# Patient Record
Sex: Female | Born: 2005
Health system: Southern US, Community
[De-identification: ages and names within clinical notes are randomized; demographics above are authoritative.]

## PROBLEM LIST (undated history)

## (undated) DIAGNOSIS — L309 Dermatitis, unspecified: Secondary | ICD-10-CM

## (undated) HISTORY — DX: Dermatitis, unspecified: L30.9

## (undated) HISTORY — PX: TONSILLECTOMY: SUR1361

## (undated) HISTORY — PX: ADENOIDECTOMY: SUR15

---
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
## 2023-01-04 ENCOUNTER — Ambulatory Visit (INDEPENDENT_AMBULATORY_CARE_PROVIDER_SITE_OTHER): Payer: Commercial Managed Care - PPO | Admitting: Orthopaedic Surgery

## 2023-01-04 DIAGNOSIS — E8889 Other specified metabolic disorders: Secondary | ICD-10-CM

## 2023-01-04 DIAGNOSIS — M25562 Pain in left knee: Secondary | ICD-10-CM | POA: Diagnosis not present

## 2023-01-04 MED ORDER — TRIAMCINOLONE ACETONIDE 40 MG/ML IJ SUSP
80.0000 mg | INTRAMUSCULAR | Status: AC | PRN
  Administered 2023-01-04: 80 mg via INTRA_ARTICULAR

## 2023-01-04 MED ORDER — LIDOCAINE HCL 1 % IJ SOLN
4.0000 mL | INTRAMUSCULAR | Status: AC | PRN
  Administered 2023-01-04: 4 mL

## 2023-01-04 NOTE — Progress Notes (Addendum)
Chief Complaint: Left worse than right knee pain     History of Present Illness:   12/28/2022: Presents today for left knee injection.  Tara Gibbs is a 17 y.o. female presents today with ongoing left worse than right medial base C-shaped pain.  This has been ongoing now for several years.  This has been limiting her ability to practice in cheerleading.  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  She did not have a specific injury.  She is here today for further discussion.    Surgical History:   None  PMH/PSH/Family History/Social History/Meds/Allergies:    Past Medical History:  Diagnosis Date  . Eczema    Past Surgical History:  Procedure Laterality Date  . ADENOIDECTOMY    . TONSILLECTOMY     Social History   Socioeconomic History  . Marital status: Single    Spouse name: Not on file  . Number of children: Not on file  . Years of education: Not on file  . Highest education level: Not on file  Occupational History  . Not on file  Tobacco Use  . Smoking status: Never  . Smokeless tobacco: Never  Vaping Use  . Vaping Use: Not on file  Substance and Sexual Activity  . Alcohol use: No  . Drug use: No  . Sexual activity: Not on file  Other Topics Concern  . Not on file  Social History Narrative  . Not on file   Social Determinants of Health   Financial Resource Strain: Not on file  Food Insecurity: Not on file  Transportation Needs: Not on file  Physical Activity: Not on file  Stress: Not on file  Social Connections: Not on file   Family History  Problem Relation Age of Onset  . Hashimoto's thyroiditis Mother   . Rheum arthritis Mother   . Migraines Mother   . Heart disease Mother   . Eczema Father   . Asthma Father    Allergies  Allergen Reactions  . Doxycycline Other (See Comments)    Depression  . Penicillins Swelling   Current Outpatient  Medications  Medication Sig Dispense Refill  . ascorbic acid (VITAMIN C) 1000 MG tablet Take by mouth. (Patient not taking: Reported on 04/10/2022)    . Cholecalciferol (VITAMIN D) 2000 units CAPS Take one capsule daily with food for 2 months and as directed by physician (Patient not taking: Reported on 04/10/2022) 60 capsule 1  . fluconazole (DIFLUCAN) 150 MG tablet Take 1 tablet by mouth today and 1 tablet 3 days from now 2 tablet 0  . metroNIDAZOLE (FLAGYL) 500 MG tablet Take 1 tablet (500 mg total) by mouth 2 (two) times daily. 14 tablet 0  . Multiple Vitamin (MULTIVITAMIN) tablet Take 1 tablet by mouth daily. (Patient not taking: Reported on 04/10/2022)    . norgestimate-ethinyl estradiol (ORTHO-CYCLEN) 0.25-35 MG-MCG tablet Take 1 tablet by mouth daily. Take only active pills (Patient not taking: Reported on 04/10/2022) 84 tablet 4  . triamcinolone cream (KENALOG) 0.1 %      No current facility-administered medications for this visit.   No results found.  Review of Systems:   A ROS was performed including pertinent positives and negatives as documented in the HPI.  Physical Exam :  Constitutional: NAD and appears stated age Neurological: Alert and oriented Psych: Appropriate affect and cooperative Last menstrual period 12/03/2022.   Comprehensive Musculoskeletal Exam:      Musculoskeletal Exam  Gait Normal  Alignment Normal   Right Left  Inspection Normal Normal  Palpation    Tenderness Medial C-shaped Medial C-shaped  Crepitus None None  Effusion None Mild  Range of Motion    Extension -5 -5  Flexion 130 130  Strength    Extension 5/5 5/5  Flexion 5/5 5/5  Ligament Exam     Generalized Laxity No No  Lachman Negative Negative   Pivot Shift Negative Negative  Anterior Drawer Negative Negative  Valgus at 0 Negative Negative  Valgus at 20 Negative Negative  Varus at 0 0 0  Varus at 20   0 0  Posterior Drawer at 90 0 0  Vascular/Lymphatic Exam    Edema None None   Venous Stasis Changes No No  Distal Circulation Normal Normal  Neurologic    Light Touch Sensation Intact Intact  Special Tests:      Imaging:   Xray (4 views right knee, 4 views left knee): Normal  MRI left knee: There is a small plica although there is significant Hoffa's fat pad hypertrophy with impingement  I personally reviewed and interpreted the radiographs.   Assessment:   17 y.o. 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  To this effect I did recommend an injection into this area to help with the swelling.  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 Consent was given by the patient. Immediately prior to procedure a time out was called to verify the correct patient, procedure, equipment, support staff and site/side marked as required. Patient was prepped and draped in the usual sterile fashion.        I personally saw and evaluated the patient, and participated in the management and treatment plan.  Vanetta Mulders, MD Attending Physician, Orthopedic Surgery  This document was dictated using Dragon voice recognition software. A reasonable attempt at proof reading has been made to minimize errors.

## 2023-01-28 ENCOUNTER — Encounter: Payer: Commercial Managed Care - PPO | Admitting: Nurse Practitioner

## 2023-01-31 ENCOUNTER — Encounter: Payer: Self-pay | Admitting: Radiology

## 2023-01-31 ENCOUNTER — Ambulatory Visit (INDEPENDENT_AMBULATORY_CARE_PROVIDER_SITE_OTHER): Payer: Commercial Managed Care - PPO | Admitting: Radiology

## 2023-01-31 VITALS — BP 102/68 | Ht 64.0 in | Wt 203.0 lb

## 2023-01-31 DIAGNOSIS — L678 Other hair color and hair shaft abnormalities: Secondary | ICD-10-CM | POA: Diagnosis not present

## 2023-01-31 DIAGNOSIS — L700 Acne vulgaris: Secondary | ICD-10-CM

## 2023-01-31 DIAGNOSIS — Z113 Encounter for screening for infections with a predominantly sexual mode of transmission: Secondary | ICD-10-CM

## 2023-01-31 DIAGNOSIS — N926 Irregular menstruation, unspecified: Secondary | ICD-10-CM | POA: Diagnosis not present

## 2023-01-31 LAB — PREGNANCY, URINE: Preg Test, Ur: NEGATIVE

## 2023-01-31 NOTE — Progress Notes (Signed)
   Tara Gibbs 18-Mar-2006 [id number]   History:  17 y.o. G0 here as a new patient c/o irregular periods, facial hair growth, acne on face, back and chest. This episdoe of irregular bleeding has lasted a month. She was on OCPs x 1 year but stopped because she was worried she would gain weight.Would like STI screening today  Gynecologic History No LMP recorded. (Menstrual status: Irregular Periods). Period Duration (Days): 5 Period Pattern: (!) Irregular Menstrual Flow: Moderate Menstrual Control: Maxi pad Dysmenorrhea: (!) Severe Dysmenorrhea Symptoms: Cramping Contraception/Family planning: condoms Sexually active: yes   Obstetric History OB History  Gravida Para Term Preterm AB Living  0 0 0 0 0 0  SAB IAB Ectopic Multiple Live Births  0 0 0 0 0     The following portions of the patient's history were reviewed and updated as appropriate: allergies, current medications, past family history, past medical history, past social history, past surgical history, and problem list.  Review of Systems Pertinent items noted in HPI and remainder of comprehensive ROS otherwise negative.   Past medical history, past surgical history, family history and social history were all reviewed and documented in the EPIC chart.   Exam:  Vitals:   01/31/23 0906  BP: 102/68  Weight: 203 lb (92.1 kg)  Height:  (1.626 m)   Body mass index is 34.84 kg/m.  General appearance:  Normal Respiratory  Auscultation:  Clear without wheezing or rhonchi Cardiovascular  Auscultation:  Regular rate, without rubs, murmurs or gallops  Edema/varicosities:  Not grossly evident Abdominal  Soft,nontender, without masses, guarding or rebound.  Liver/spleen:  No organomegaly noted  Hernia:  None appreciated  Skin  Inspection:  comedones on face and chest Genitourinary   Inguinal/mons:  Normal without inguinal adenopathy  External genitalia:  Normal appearing vulva with no masses, tenderness, or  lesions  BUS/Urethra/Skene's glands:  Normal without masses or exudate  Vagina:  Blood present, STI screen obtained via sureswab  Cervix:  Normal appearing without discharge or lesions  Uterus:  Normal in size, shape and contour.  Mobile, nontender  Adnexa/parametria:     Rt: Normal in size, without masses or tenderness.   Lt: Normal in size, without masses or tenderness.  Anus and perineum: Normal   Raynelle Fanning, CMA present for exam  Assessment/Plan:   1. Irregular menses - Pregnancy, urine; neg - DHEA-sulfate - FSH - LH - Prolactin  2. Acne vulgaris - Testos,Total,Free and SHBG (Female) - DHEA-sulfate  3. Abnormal facial hair - Testos,Total,Free and SHBG (Female) - DHEA-sulfate   Follow up 1 week to discuss labs  Arlie Solomons B WHNP-BC 9:35 AM 01/31/2023

## 2023-02-01 LAB — SURESWAB CT/NG/T. VAGINALIS
C. trachomatis RNA, TMA: NOT DETECTED
N. gonorrhoeae RNA, TMA: NOT DETECTED
Trichomonas vaginalis RNA: NOT DETECTED

## 2023-02-06 LAB — DHEA-SULFATE: DHEA-SO4: 278 ug/dL (ref 44–286)

## 2023-02-06 LAB — TESTOS,TOTAL,FREE AND SHBG (FEMALE)
Free Testosterone: 4.3 pg/mL (ref 0.1–6.4)
Sex Hormone Binding: 11.9 nmol/L — ABNORMAL LOW (ref 17–124)
Testosterone, Total, LC-MS-MS: 24 ng/dL (ref 2–45)

## 2023-02-06 LAB — LUTEINIZING HORMONE: LH: 9.5 m[IU]/mL

## 2023-02-06 LAB — FOLLICLE STIMULATING HORMONE: FSH: 9 m[IU]/mL

## 2023-02-06 LAB — PROLACTIN: Prolactin: 12.7 ng/mL

## 2023-02-08 ENCOUNTER — Ambulatory Visit (INDEPENDENT_AMBULATORY_CARE_PROVIDER_SITE_OTHER): Payer: Commercial Managed Care - PPO | Admitting: Radiology

## 2023-02-08 ENCOUNTER — Encounter: Payer: Self-pay | Admitting: Radiology

## 2023-02-08 VITALS — BP 118/76

## 2023-02-08 DIAGNOSIS — L680 Hirsutism: Secondary | ICD-10-CM

## 2023-02-08 DIAGNOSIS — N926 Irregular menstruation, unspecified: Secondary | ICD-10-CM | POA: Diagnosis not present

## 2023-02-08 MED ORDER — DROSPIRENONE-ETHINYL ESTRADIOL 3-0.02 MG PO TABS: 1.0000 | ORAL_TABLET | Freq: Every day | ORAL | 0 refills | Status: DC

## 2023-02-08 NOTE — Progress Notes (Signed)
   Tara Gibbs 06-Aug-2006 [id number]   History:  17 y.o. G0 here for follow up labs after she presented last week as a new patient c/o irregular periods, facial hair growth, acne on face, back and chest. This episdoe of irregular bleeding has lasted a month. She was on OCPs x 1 year but stopped because she was worried she would gain weight.  Gynecologic History No LMP recorded. (Menstrual status: Irregular Periods).   Contraception/Family planning: condoms Sexually active: yes   Obstetric History OB History  Gravida Para Term Preterm AB Living  0 0 0 0 0 0  SAB IAB Ectopic Multiple Live Births  0 0 0 0 0     The following portions of the patient's history were reviewed and updated as appropriate: allergies, current medications, past family history, past medical history, past social history, past surgical history, and problem list.  Review of Systems Pertinent items noted in HPI and remainder of comprehensive ROS otherwise negative.   Past medical history, past surgical history, family history and social history were all reviewed and documented in the EPIC chart.   Latest Reference Range & Units 01/31/23 09:23  DHEA-SO4 44 - 286 mcg/dL 409  LH mIU/mL 9.5  FSH mIU/mL 9.0  Prolactin ng/mL 12.7  Free Testosterone 0.1 - 6.4 pg/mL 4.3  Sex Horm Binding Glob, Serum 17 - 124 nmol/L 11.9 (L)  Testosterone, Total, LC-MS-MS 2 - 45 ng/dL 24  (L): Data is abnormally low  Exam:  Vitals:   02/08/23 1517  BP: 118/76   There is no height or weight on file to calculate BMI.   Assessment/Plan:   1. Irregular menses  2. Hirsutism - drospirenone-ethinyl estradiol (YAZ) 3-0.02 MG tablet; Take 1 tablet by mouth daily.  Dispense: 84 tablet; Refill: 0   Follow up 3 months  Kenise Barraco B WHNP-BC 3:28 PM 02/08/2023

## 2023-02-14 DIAGNOSIS — Z0279 Encounter for issue of other medical certificate: Secondary | ICD-10-CM | POA: Diagnosis not present

## 2023-02-26 ENCOUNTER — Encounter: Payer: Commercial Managed Care - PPO | Admitting: Nurse Practitioner

## 2023-02-27 ENCOUNTER — Encounter (HOSPITAL_BASED_OUTPATIENT_CLINIC_OR_DEPARTMENT_OTHER): Payer: Self-pay | Admitting: Physical Therapy

## 2023-02-27 ENCOUNTER — Ambulatory Visit (HOSPITAL_BASED_OUTPATIENT_CLINIC_OR_DEPARTMENT_OTHER): Payer: Commercial Managed Care - PPO | Attending: Orthopaedic Surgery | Admitting: Physical Therapy

## 2023-02-27 ENCOUNTER — Other Ambulatory Visit: Payer: Self-pay

## 2023-02-27 DIAGNOSIS — M25561 Pain in right knee: Secondary | ICD-10-CM | POA: Insufficient documentation

## 2023-02-27 DIAGNOSIS — M25562 Pain in left knee: Secondary | ICD-10-CM | POA: Insufficient documentation

## 2023-02-27 DIAGNOSIS — G8929 Other chronic pain: Secondary | ICD-10-CM | POA: Diagnosis not present

## 2023-02-27 DIAGNOSIS — R2689 Other abnormalities of gait and mobility: Secondary | ICD-10-CM

## 2023-02-27 DIAGNOSIS — E8889 Other specified metabolic disorders: Secondary | ICD-10-CM | POA: Insufficient documentation

## 2023-02-27 NOTE — Therapy (Signed)
OUTPATIENT PHYSICAL THERAPY LOWER EXTREMITY EVALUATION   Patient Name: Tara Gibbs MRN: [id number] DOB:2006-04-22, 17 y.o., female Today's Date: 02/27/2023  END OF SESSION:  PT End of Session - 02/27/23 1434     Visit Number 1    Number of Visits 6    Date for PT Re-Evaluation 04/10/23    PT Start Time 0804    PT Stop Time 0845    PT Time Calculation (min) 41 min    Activity Tolerance Patient tolerated treatment well    Behavior During Therapy [facility] for tasks assessed/performed             Past Medical History:  Diagnosis Date   Eczema    Past Surgical History:  Procedure Laterality Date   ADENOIDECTOMY     TONSILLECTOMY     Patient Active Problem List   Diagnosis Date Noted   Arthralgia 05/10/2021   Irregular menses 06/12/2019   Chronic back pain 12/04/2016   Acne 07/11/2016   State of stress 02/29/2016   Chronic rhinitis 01/13/2016   Frequent headaches 01/13/2016   Right ankle pain 07/18/2015   Obesity, unspecified 06/07/2014   Eczema 06/07/2014    PCP: Dr [name]   REFERRING PROVIDER: Dr Huel Cote   REFERRING DIAG:  Diagnosis  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 Bith knees hurt but the left is the worst. She has increasesd pain when she stands and does cheerleading. She can also have pain with long periods of sitting. 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   PERTINENT HISTORY: Right anke sprain remote; Low back pain  PAIN:  Are you having pain? Yes: NPRS scale:5 /10 Pain location: patella area and medial superior left knee  Pain description: aching   Aggravating factors: sports, standing, sitting  Relieving factors: rest   PRECAUTIONS: None  WEIGHT BEARING RESTRICTIONS: No  FALLS:  Has patient fallen in last 6 months? No  LIVING ENVIRONMENT: Stairs up to her house. Can feel it in her knee.  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: Cheerleading   PLOF: Independent  PATIENT GOALS:  To have less pain in her knee   NEXT MD VISIT:  Nothing scheduled   OBJECTIVE:   DIAGNOSTIC FINDINGS:  X-ray: (-)   PATIENT SURVEYS:  FOTO    COGNITION: Overall cognitive status: Within functional limits for tasks assessed     SENSATION: WFL  EDEMA:   MUSCLE LENGTH:  POSTURE: No Significant postural limitations  PALPATION:   LOWER EXTREMITY ROM:  Passive ROM Right eval Left eval  Hip flexion    Hip extension    Hip abduction    Hip adduction    Hip internal rotation    Hip external rotation    Knee flexion Pain at end range  Pain at end range   Knee extension    Ankle dorsiflexion    Ankle plantarflexion    Ankle inversion    Ankle eversion     (Blank rows = not tested)  LOWER EXTREMITY MMT:  MMT Right eval Left eval  Hip flexion 44.1 45.1  Hip extension    Hip abduction 47.2 47.5  Hip adduction    Hip internal rotation    Hip external rotation    Knee flexion    Knee extension 43.9 45.6  Ankle dorsiflexion    Ankle plantarflexion    Ankle inversion    Ankle eversion     (Blank rows = not tested)  LOWER EXTREMITY SPECIAL TESTS:  Squat: good form. Patient reportred  Single leg stance: More limited on the left     GAIT: No significant gait deviations  TODAY'S TREATMENT:                                                                                                                              DATE:   Access Code: [email or web address] URL: [email or web address] Date: 02/27/2023 Prepared by: Lorayne Bender  Exercises - Supine Bridge with Resistance  Band  - 1 x daily - 7 x weekly - 3 sets - 10 reps - Supine Active Straight Leg Raise  - 1 x daily - 7 x weekly - 3 sets - 10 reps - Side Stepping with Resistance at Ankles  - 1 x daily - 7 x weekly - 3 sets - 10 reps   PATIENT EDUCATION:  Education details: HEP, symptom management  Person educated: Patient Education method: Explanation, Demonstration, Tactile cues, Verbal cues, and Handouts Education comprehension: verbalized understanding, returned demonstration, verbal cues required, tactile cues required, and needs further education  HOME EXERCISE PROGRAM: Access Code: [email or web address] URL: [email or web address] Date: 02/27/2023 Prepared by: Lorayne Bender  Exercises - Supine Bridge with Resistance Band  - 1 x daily - 7 x weekly - 3 sets - 10 reps - Supine Active Straight Leg Raise  - 1 x daily - 7 x weekly - 3 sets - 10 reps - Side Stepping with Resistance at Ankles  - 1 x daily - 7 x weekly - 3 sets - 10 reps  ASSESSMENT:  CLINICAL IMPRESSION: Patient is a 17 year old female with bilateral knee pain left greater than right.  Her signs and symptoms are consistent with diagnosis of Hoffa's fat pad inflammation.  She also has tenderness to palpation in her left patellar tendon area.  She has increased pain with standing and walking.  She has  pain with end range left knee flexion.  She has mild quad weakness.  She would benefit from skilled therapy to decrease pain and increase her ability to perform activities that she likes to do as well as work activities.  OBJECTIVE IMPAIRMENTS: decreased activity tolerance, decreased strength, and pain.   ACTIVITY LIMITATIONS: carrying, lifting, standing, squatting, stairs, and locomotion level  PARTICIPATION LIMITATIONS:  sporting activity; work   PERSONAL FACTORS: 1 comorbidity: low back pain   are also affecting patient's functional outcome.   REHAB POTENTIAL: Excellent  CLINICAL DECISION MAKING: Stable/uncomplicated  EVALUATION  COMPLEXITY: Low   GOALS: Goals reviewed with patient? Yes  SHORT TERM GOALS: Target date:  03/20/2023    Patient will report a 50% reduction in tenderness to palpation in the left knee Baseline: Goal status: INITIAL  2.  Patient will increase gross bilateral LE strength by 5 lbs  Baseline:  Goal status: INITIAL  3.  Patient will be independent with base HEP  Baseline:  Goal status: INITIAL  LONG TERM GOALS: Target date: 04/24/2023    Patient will stand at work for greater than 1 hour without increased pain Baseline:  Goal status: INITIAL  2.  Patient will go up and down 8 steps without increased pain in order to get in and out of her house comfortably Baseline:  Goal status: INITIAL  3.  Patient will ambulate community distances without increased pain Baseline:  Goal status: INITIAL   PLAN:  PT FREQUENCY: 1x/week  PT DURATION: 6 weeks  PLANNED INTERVENTIONS: Therapeutic exercises, Therapeutic activity, Neuromuscular re-education, Balance training, Gait training, Patient/Family education, Self Care, Joint mobilization, Stair training, Aquatic Therapy, Dry Needling, Electrical stimulation, Cryotherapy, Moist heat, Taping, Ultrasound, Ionotophoresis 4mg /ml Dexamethasone, and Manual therapy  PLAN FOR NEXT SESSION: Consider taping next visit either for patellar tendinitis and for knee Stabilization.  Review current exercises.  Progress to eccentric's as tolerated.  Consider slant board squat.  Consider eccentric stepdown.  Consider single-leg stance.  If patient did not tire tolerate higher level activities continue to progress lower level activities.   Dessie Coma, PT 02/27/2023, 2:38 PM

## 2023-02-28 ENCOUNTER — Encounter (HOSPITAL_BASED_OUTPATIENT_CLINIC_OR_DEPARTMENT_OTHER): Payer: Self-pay | Admitting: Orthopaedic Surgery

## 2023-03-01 ENCOUNTER — Ambulatory Visit (HOSPITAL_BASED_OUTPATIENT_CLINIC_OR_DEPARTMENT_OTHER): Payer: Commercial Managed Care - PPO | Admitting: Orthopaedic Surgery

## 2023-03-06 ENCOUNTER — Ambulatory Visit (HOSPITAL_BASED_OUTPATIENT_CLINIC_OR_DEPARTMENT_OTHER): Payer: Commercial Managed Care - PPO | Admitting: Orthopaedic Surgery

## 2023-03-08 ENCOUNTER — Ambulatory Visit (HOSPITAL_BASED_OUTPATIENT_CLINIC_OR_DEPARTMENT_OTHER): Payer: Commercial Managed Care - PPO | Admitting: Orthopaedic Surgery

## 2023-03-09 ENCOUNTER — Ambulatory Visit (HOSPITAL_BASED_OUTPATIENT_CLINIC_OR_DEPARTMENT_OTHER): Payer: Commercial Managed Care - PPO | Admitting: Physical Therapy

## 2023-03-09 ENCOUNTER — Encounter (HOSPITAL_BASED_OUTPATIENT_CLINIC_OR_DEPARTMENT_OTHER): Payer: Self-pay | Admitting: Physical Therapy

## 2023-03-09 DIAGNOSIS — G8929 Other chronic pain: Secondary | ICD-10-CM

## 2023-03-09 DIAGNOSIS — M25561 Pain in right knee: Secondary | ICD-10-CM | POA: Diagnosis not present

## 2023-03-09 DIAGNOSIS — R2689 Other abnormalities of gait and mobility: Secondary | ICD-10-CM | POA: Diagnosis not present

## 2023-03-09 DIAGNOSIS — M25562 Pain in left knee: Secondary | ICD-10-CM | POA: Diagnosis not present

## 2023-03-09 DIAGNOSIS — E8889 Other specified metabolic disorders: Secondary | ICD-10-CM | POA: Diagnosis not present

## 2023-03-09 NOTE — Therapy (Signed)
OUTPATIENT PHYSICAL THERAPY LOWER EXTREMITY EVALUATION   Patient Name: Tara Gibbs MRN: [id number] DOB:2006-06-09, 17 y.o., female Today's Date: 03/09/2023  END OF SESSION:  PT End of Session - 03/09/23 1024     Visit Number 2    Number of Visits 6    Date for PT Re-Evaluation 04/10/23    PT Start Time 1015    PT Stop Time 1058    PT Time Calculation (min) 43 min    Activity Tolerance Patient tolerated treatment well    Behavior During Therapy [facility] for tasks assessed/performed             Past Medical History:  Diagnosis Date   Eczema    Past Surgical History:  Procedure Laterality Date   ADENOIDECTOMY     TONSILLECTOMY     Patient Active Problem List   Diagnosis Date Noted   Arthralgia 05/10/2021   Irregular menses 06/12/2019   Chronic back pain 12/04/2016   Acne 07/11/2016   State of stress 02/29/2016   Chronic rhinitis 01/13/2016   Frequent headaches 01/13/2016   Right ankle pain 07/18/2015   Obesity, unspecified 06/07/2014   Eczema 06/07/2014    PCP: Dr [name]   REFERRING PROVIDER: Dr Huel Cote   REFERRING DIAG:  Diagnosis  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 Bith knees hurt but the left is the worst. She has increasesd pain when she stands and does cheerleading. She can also have pain with long periods of sitting. 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   PERTINENT HISTORY: Right anke sprain remote; Low back pain  PAIN:  Are you having pain? Yes: NPRS scale:5 /10 Pain location: patella area and medial superior left knee  Pain description: aching   Aggravating factors: sports, standing, sitting  Relieving factors: rest   PRECAUTIONS: None  WEIGHT BEARING RESTRICTIONS: No  FALLS:  Has patient fallen in last 6 months? No  LIVING ENVIRONMENT: Stairs up to her house. Can feel it in her knee.  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: Cheerleading   PLOF: Independent  PATIENT GOALS:  To have less pain in her knee   NEXT MD VISIT:  Nothing scheduled   OBJECTIVE:   DIAGNOSTIC FINDINGS:  X-ray: (-)   PATIENT SURVEYS:  FOTO    COGNITION: Overall cognitive status: Within functional limits for tasks assessed     SENSATION: WFL  EDEMA:   MUSCLE LENGTH:  POSTURE: No Significant postural limitations  PALPATION:   LOWER EXTREMITY ROM:  Passive ROM Right eval Left eval  Hip flexion    Hip extension    Hip abduction    Hip adduction    Hip internal rotation    Hip external rotation    Knee flexion Pain at end range  Pain at end range   Knee extension    Ankle dorsiflexion    Ankle plantarflexion    Ankle inversion    Ankle eversion     (Blank rows = not tested)  LOWER EXTREMITY MMT:  MMT Right eval Left eval  Hip flexion 44.1 45.1  Hip extension    Hip abduction 47.2 47.5  Hip adduction    Hip internal rotation    Hip external rotation    Knee flexion    Knee extension 43.9 45.6  Ankle dorsiflexion    Ankle plantarflexion    Ankle inversion    Ankle eversion     (Blank rows = not tested)  LOWER EXTREMITY SPECIAL TESTS:  Squat: good form. Patient reportred  Single leg stance: More limited on the left     GAIT: No significant gait deviations  TODAY'S TREATMENT:                                                                                                                              DATE:  5/25 Manual: reviewed taping; benefits; donning/doffing using mconnel tape   SLR: bilateral extensor lag greater 2x10  Bridge 2x10  SL SLR 2x10    Leg  press 50 lbs 2x15 cybex     Eval: Access Code: [email or web address] URL: [email or web address] Date: 02/27/2023 Prepared by: Lorayne Bender  Exercises - Supine Bridge with Resistance Band  - 1 x daily - 7 x weekly - 3 sets - 10 reps - Supine Active Straight Leg Raise  - 1 x daily - 7 x weekly - 3 sets - 10 reps - Side Stepping with Resistance at Ankles  - 1 x daily - 7 x weekly - 3 sets - 10 reps   PATIENT EDUCATION:  Education details: HEP, symptom management  Person educated: Patient Education method: Explanation, Demonstration, Tactile cues, Verbal cues, and Handouts Education comprehension: verbalized understanding, returned demonstration, verbal cues required, tactile cues required, and needs further education  HOME EXERCISE PROGRAM: Access Code: [email or web address] URL: [email or web address] Date: 02/27/2023 Prepared by: Lorayne Bender  Exercises - Supine Bridge with Resistance Band  - 1 x daily - 7 x weekly - 3 sets - 10 reps - Supine Active Straight Leg Raise  - 1 x daily - 7 x weekly - 3 sets - 10 reps - Side Stepping with Resistance at Ankles  - 1 x daily - 7 x weekly - 3 sets - 10 reps  ASSESSMENT:  CLINICAL IMPRESSION: The patient was still challenged by her base exercises today. She was advised to continue with her current HEP at home. We added in the leg press today. We trialed [place] taping for knee stability. If it helps we can let her do it on her own next visit. Therapy will continue to progress as tolerated.  OBJECTIVE IMPAIRMENTS: decreased activity tolerance, decreased strength, and pain.   ACTIVITY LIMITATIONS: carrying, lifting, standing, squatting, stairs, and locomotion level  PARTICIPATION LIMITATIONS:  sporting activity; work   PERSONAL FACTORS: 1 comorbidity: low back pain   are also affecting patient's functional outcome.   REHAB POTENTIAL: Excellent  CLINICAL DECISION MAKING: Stable/uncomplicated  EVALUATION COMPLEXITY:  Low   GOALS: Goals reviewed with patient? Yes  SHORT TERM GOALS: Target date:  03/20/2023    Patient will report a 50% reduction in tenderness to palpation in the left knee Baseline: Goal status: INITIAL  2.  Patient will increase gross bilateral LE strength by 5 lbs  Baseline:  Goal status: INITIAL  3.  Patient will be independent with base HEP  Baseline:  Goal status: INITIAL  LONG TERM GOALS: Target date: 04/24/2023    Patient will stand at work for greater than 1 hour without increased pain Baseline:  Goal status: INITIAL  2.  Patient will go up and down 8 steps without increased pain in order to get in and out of her house comfortably Baseline:  Goal status: INITIAL  3.  Patient will ambulate community distances without increased pain Baseline:  Goal status: INITIAL   PLAN:  PT FREQUENCY: 1x/week  PT DURATION: 6 weeks  PLANNED INTERVENTIONS: Therapeutic exercises, Therapeutic activity, Neuromuscular re-education, Balance training, Gait training, Patient/Family education, Self Care, Joint mobilization, Stair training, Aquatic Therapy, Dry Needling, Electrical stimulation, Cryotherapy, Moist heat, Taping, Ultrasound, Ionotophoresis 4mg /ml Dexamethasone, and Manual therapy  PLAN FOR NEXT SESSION: Consider taping next visit either for patellar tendinitis and for knee Stabilization.  Review current exercises.  Progress to eccentric's as tolerated.  Consider slant board squat.  Consider eccentric stepdown.  Consider single-leg stance.  If patient did not tire tolerate higher level activities continue to progress lower level activities.   Dessie Coma, PT 03/09/2023, 10:29 AM

## 2023-03-16 ENCOUNTER — Ambulatory Visit (HOSPITAL_BASED_OUTPATIENT_CLINIC_OR_DEPARTMENT_OTHER)
Payer: Commercial Managed Care - PPO | Attending: Orthopaedic Surgery | Admitting: Rehabilitative and Restorative Service Providers"

## 2023-03-16 ENCOUNTER — Encounter (HOSPITAL_BASED_OUTPATIENT_CLINIC_OR_DEPARTMENT_OTHER): Payer: Self-pay | Admitting: Rehabilitative and Restorative Service Providers"

## 2023-03-16 DIAGNOSIS — R2689 Other abnormalities of gait and mobility: Secondary | ICD-10-CM | POA: Diagnosis not present

## 2023-03-16 DIAGNOSIS — M25561 Pain in right knee: Secondary | ICD-10-CM | POA: Insufficient documentation

## 2023-03-16 DIAGNOSIS — G8929 Other chronic pain: Secondary | ICD-10-CM | POA: Diagnosis not present

## 2023-03-16 DIAGNOSIS — M25562 Pain in left knee: Secondary | ICD-10-CM | POA: Insufficient documentation

## 2023-03-16 NOTE — Therapy (Signed)
OUTPATIENT PHYSICAL THERAPY LOWER EXTREMITY EVALUATION   Patient Name: Tara Gibbs MRN: [id number] DOB:22-Jan-2006, 17 y.o., female Today's Date: 03/16/2023  END OF SESSION:  PT End of Session - 03/16/23 0928     Visit Number 3    Number of Visits 6    Date for PT Re-Evaluation 04/10/23    PT Start Time 0919    PT Stop Time 1002    PT Time Calculation (min) 43 min    Activity Tolerance Patient tolerated treatment well;No increased pain    Behavior During Therapy [facility] for tasks assessed/performed             Past Medical History:  Diagnosis Date   Eczema    Past Surgical History:  Procedure Laterality Date   ADENOIDECTOMY     TONSILLECTOMY     Patient Active Problem List   Diagnosis Date Noted   Arthralgia 05/10/2021   Irregular menses 06/12/2019   Chronic back pain 12/04/2016   Acne 07/11/2016   State of stress 02/29/2016   Chronic rhinitis 01/13/2016   Frequent headaches 01/13/2016   Right ankle pain 07/18/2015   Obesity, unspecified 06/07/2014   Eczema 06/07/2014    PCP: Dr [name]   REFERRING PROVIDER: Dr Huel Cote   REFERRING DIAG:  Diagnosis  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 It is uncomfortable. I don't feel like the taping helped. It felt like a restraint. Standing, sitting, and stairs increases the pressure.    PERTINENT HISTORY: Right anke sprain remote; Low back pain  PAIN:  Are you having pain? Yes: NPRS scale:4/10 Pain location: patella area and medial superior left knee  Pain description: aching  Aggravating factors: sports, standing, sitting  Relieving factors: rest   PRECAUTIONS:  None  WEIGHT BEARING RESTRICTIONS: No  FALLS:  Has patient fallen in last 6 months? No  LIVING ENVIRONMENT: Stairs up to her house. Can feel it in her knee.  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: Cheerleading   PLOF: Independent  PATIENT GOALS:  To have less pain in her knee   NEXT MD VISIT:  Nothing scheduled   OBJECTIVE:   DIAGNOSTIC FINDINGS:  X-ray: (-)   PATIENT SURVEYS:  FOTO    COGNITION: Overall cognitive status: Within functional limits for tasks assessed     SENSATION: WFL  EDEMA:   MUSCLE LENGTH:  POSTURE: No Significant postural limitations  PALPATION:   LOWER EXTREMITY ROM:  Passive ROM Right eval Left eval  Hip flexion    Hip extension    Hip abduction    Hip adduction    Hip internal rotation    Hip external rotation    Knee flexion Pain at end range  Pain at end range   Knee extension    Ankle dorsiflexion    Ankle plantarflexion    Ankle inversion    Ankle eversion     (Blank rows = not tested)  LOWER EXTREMITY MMT:  MMT Right eval Left eval  Hip flexion 44.1 45.1  Hip extension    Hip abduction 47.2 47.5  Hip adduction  Hip internal rotation    Hip external rotation    Knee flexion    Knee extension 43.9 45.6  Ankle dorsiflexion    Ankle plantarflexion    Ankle inversion    Ankle eversion     (Blank rows = not tested)  LOWER EXTREMITY SPECIAL TESTS:  Squat: good form. Patient reportred  Single leg stance: More limited on the left     GAIT: No significant gait deviations  TODAY'S TREATMENT:                                                                                                                              DATE:   [facility] Adult PT Treatment:                                                DATE: 03/16/23 Therapeutic Exercise: Attempted elliptical forward and backward x 1 min each to determine pressure and tolerance with pt stating increased pressure both  directions Nustep level 6 x 3 min with focus on quad contraction and control SLR x 15 VMO SLR x 15 SAQ 3 lbs x 20 unilat and bil each Ball squeeze supine x 20 Ball squeeze with bridge x 20 Reviewed HEP; modified monster walks for pt to keep knees in neutral and not angulated. Discussed with pt importance of icing x 15 min 2x/day to assist with inflammation   5/25 Manual: reviewed taping; benefits; donning/doffing using mconnel tape   SLR: bilateral extensor lag greater 2x10  Bridge 2x10  SL SLR 2x10    Leg press 50 lbs 2x15 cybex     Eval: Access Code: [email or web address] URL: [email or web address] Date: 02/27/2023 Prepared by: Lorayne Bender  Exercises - Supine Bridge with Resistance Band  - 1 x daily - 7 x weekly - 3 sets - 10 reps - Supine Active Straight Leg Raise  - 1 x daily - 7 x weekly - 3 sets - 10 reps - Side Stepping with Resistance at Ankles  - 1 x daily - 7 x weekly - 3 sets - 10 reps   PATIENT EDUCATION:  Education details: HEP, symptom management  Person educated: Patient Education method: Explanation, Demonstration, Tactile cues, Verbal cues, and Handouts Education comprehension: verbalized understanding, returned demonstration, verbal cues required, tactile cues required, and needs further education  HOME EXERCISE PROGRAM: Access Code: [email or web address] URL: [email or web address] Date: 02/27/2023 Prepared by: Lorayne Bender  Exercises - Supine Bridge with Resistance Band  - 1 x daily - 7 x weekly - 3 sets - 10 reps - Supine Active Straight Leg Raise  - 1 x daily - 7 x weekly - 3 sets - 10 reps - Side Stepping with Resistance at Ankles  - 1 x daily - 7 x weekly - 3 sets - 10 reps  ASSESSMENT:  CLINICAL IMPRESSION: Pt reports she  was not favorable of [place] taping and she has been compliant with HEP. 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 Pt with continued extensor lag with therex L LE but can self-correct with cueing. Pt reports pain to be predominantly in L LE with functional mobility. Bil hip adductors L > R not as strong a contraction as rest of Quad. Therapy will continue to progress as tolerated.  OBJECTIVE IMPAIRMENTS: decreased activity tolerance, decreased strength, and pain.   ACTIVITY LIMITATIONS: carrying, lifting, standing, squatting, stairs, and locomotion level  PARTICIPATION LIMITATIONS:  sporting activity; work   PERSONAL FACTORS: 1 comorbidity: low back pain   are also affecting patient's functional outcome.   REHAB POTENTIAL: Excellent  CLINICAL DECISION MAKING: Stable/uncomplicated  EVALUATION COMPLEXITY: Low   GOALS: Goals reviewed with patient? Yes  SHORT TERM GOALS: Target date: 03/20/2023    Patient will report a 50% reduction in tenderness to palpation in the left knee Baseline: Goal status: INITIAL  2.  Patient will increase gross bilateral LE strength by 5 lbs  Baseline:  Goal status: INITIAL  3.  Patient will be independent with base HEP  Baseline:  Goal status: INITIAL  LONG TERM GOALS: Target date: 04/24/2023    Patient will stand at work for greater than 1 hour without increased pain Baseline:  Goal status: INITIAL  2.  Patient will go up and down 8 steps without increased pain in order to get in and out of her house comfortably Baseline:  Goal status: INITIAL  3.  Patient will ambulate community distances without increased pain Baseline:  Goal status: INITIAL   PLAN:  PT FREQUENCY: 1x/week  PT DURATION: 6 weeks  PLANNED INTERVENTIONS: Therapeutic exercises, Therapeutic activity, Neuromuscular re-education, Balance training, Gait training, Patient/Family education, Self Care, Joint mobilization, Stair training, Aquatic Therapy, Dry Needling, Electrical stimulation, Cryotherapy, Moist heat, Taping, Ultrasound, Ionotophoresis 4mg /ml Dexamethasone,  and Manual therapy  PLAN FOR NEXT SESSION:  Progress to eccentric's as tolerated.  Consider slant board squat.  Consider eccentric stepdown.  Consider single-leg stance. Work on hip adductors bil.  If patient did not tolerate higher level activities continue to progress lower level activities.

## 2023-03-21 ENCOUNTER — Ambulatory Visit (HOSPITAL_BASED_OUTPATIENT_CLINIC_OR_DEPARTMENT_OTHER): Payer: Commercial Managed Care - PPO | Admitting: Orthopaedic Surgery

## 2023-03-23 ENCOUNTER — Ambulatory Visit (HOSPITAL_BASED_OUTPATIENT_CLINIC_OR_DEPARTMENT_OTHER): Payer: Commercial Managed Care - PPO

## 2023-03-29 ENCOUNTER — Ambulatory Visit (HOSPITAL_BASED_OUTPATIENT_CLINIC_OR_DEPARTMENT_OTHER): Payer: Commercial Managed Care - PPO | Admitting: Orthopaedic Surgery

## 2023-03-30 ENCOUNTER — Ambulatory Visit (HOSPITAL_BASED_OUTPATIENT_CLINIC_OR_DEPARTMENT_OTHER): Payer: Commercial Managed Care - PPO | Admitting: Rehabilitative and Restorative Service Providers"

## 2023-04-03 ENCOUNTER — Ambulatory Visit (HOSPITAL_BASED_OUTPATIENT_CLINIC_OR_DEPARTMENT_OTHER): Payer: Self-pay | Admitting: Orthopaedic Surgery

## 2023-04-03 ENCOUNTER — Ambulatory Visit (INDEPENDENT_AMBULATORY_CARE_PROVIDER_SITE_OTHER): Payer: Commercial Managed Care - PPO | Admitting: Orthopaedic Surgery

## 2023-04-03 DIAGNOSIS — M6752 Plica syndrome, left knee: Secondary | ICD-10-CM

## 2023-04-03 DIAGNOSIS — E8889 Other specified metabolic disorders: Secondary | ICD-10-CM

## 2023-04-03 NOTE — Progress Notes (Signed)
Chief Complaint: Left worse than right knee pain     History of Present Illness:   04/03/2023: Presents today for follow-up of her left knee.  This is continuing to be painful when standing for longer periods of time.  She has begun CNA training and as result is having a very hard time staying active on the leg all day.  She is here today for further discussion.  She did get initially very good relief from her injection but this is worn off  Tara Gibbs is a 17 y.o. female presents today with ongoing left worse than right medial base C-shaped pain.  This has been ongoing now for several years.  This has been limiting her ability to practice in cheerleading.  She is a Holiday representative at [employer].  She is experiencing swelling after most activities and practices.  She did not have a specific injury.  She is here today for further discussion.    Surgical History:   None  PMH/PSH/Family History/Social History/Meds/Allergies:    Past Medical History:  Diagnosis Date   Eczema    Past Surgical History:  Procedure Laterality Date   ADENOIDECTOMY     TONSILLECTOMY     Social History   Socioeconomic History   Marital status: Single    Spouse name: Not on file   Number of children: Not on file   Years of education: Not on file   Highest education level: Not on file  Occupational History   Not on file  Tobacco Use   Smoking status: Never    Passive exposure: Never   Smokeless tobacco: Never  Vaping Use   Vaping Use: Not on file  Substance and Sexual Activity   Alcohol use: No   Drug use: No   Sexual activity: Yes    Partners: Male    Birth control/protection: None, Condom    Comment: menarche 17yo, sexual debut 17yo  Other Topics Concern   Not on file  Social History Narrative   Not on file   Social Determinants of Health   Financial Resource Strain: Not on file  Food Insecurity: Not on file  Transportation Needs: Not on file   Physical Activity: Not on file  Stress: Not on file  Social Connections: Not on file   Family History  Problem Relation Age of Onset   Hashimoto's thyroiditis Mother    Rheum arthritis Mother    Migraines Mother    Heart disease Mother    Polycystic ovary syndrome Mother    Eczema Father    Asthma Father    Allergies  Allergen Reactions   Doxycycline Other (See Comments)    Depression   Penicillins Swelling   Current Outpatient Medications  Medication Sig Dispense Refill   drospirenone-ethinyl estradiol (YAZ) 3-0.02 MG tablet Take 1 tablet by mouth daily. 84 tablet 0   No current facility-administered medications for this visit.   No results found.  Review of Systems:   A ROS was performed including pertinent positives and negatives as documented in the HPI.  Physical Exam :   Constitutional: NAD and appears stated age Neurological: Alert and oriented Psych: Appropriate affect and cooperative There were no vitals taken for this visit.   Comprehensive Musculoskeletal Exam:      Musculoskeletal Exam  Gait Normal  Alignment  Normal   Right Left  Inspection Normal Normal  Palpation    Tenderness Medial C-shaped Medial C-shaped  Crepitus None None  Effusion None Mild  Range of Motion    Extension -5 -5  Flexion 130 130  Strength    Extension 5/5 5/5  Flexion 5/5 5/5  Ligament Exam     Generalized Laxity No No  Lachman Negative Negative   Pivot Shift Negative Negative  Anterior Drawer Negative Negative  Valgus at 0 Negative Negative  Valgus at 20 Negative Negative  Varus at 0 0 0  Varus at 20   0 0  Posterior Drawer at 90 0 0  Vascular/Lymphatic Exam    Edema None None  Venous Stasis Changes No No  Distal Circulation Normal Normal  Neurologic    Light Touch Sensation Intact Intact  Special Tests: Positive C-shaped pain about the left knee medially     Imaging:   Xray (4 views right knee, 4 views left knee): Normal  MRI left knee: There is a  small plica although there is significant Hoffa's fat pad hypertrophy with impingement  I personally reviewed and interpreted the radiographs.   Assessment:   17 y.o. 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  I did describe that at today's visit she has undergone physical therapy which did somewhat aggravate the pain in addition to an injection which did give her transient relief.  Given the fact that these have not been more permanent solutions I do believe she would ultimately benefit from left knee arthroscopy with plica excision in office debridement.  At this time she would like to consider this.  We did discuss the risks and limitations associated with this.  She would like to proceed with this after discussion Plan :    -Plan for left knee arthroscopy with plica excision and Hoffa's fat pad debridement   After a lengthy discussion of treatment options, including risks, benefits, alternatives, complications of surgical and nonsurgical conservative options, the patient elected surgical repair.   The patient  is aware of the material risks  and complications including, but not limited to injury to adjacent structures, neurovascular injury, infection, numbness, bleeding, implant failure, thermal burns, stiffness, persistent pain, failure to heal, disease transmission from allograft, need for further surgery, dislocation, anesthetic risks, blood clots, risks of death,and others. The probabilities of surgical success and failure discussed with patient given their particular co-morbidities.The time and nature of expected rehabilitation and recovery was discussed.The patient's questions were all answered preoperatively.  No barriers to understanding were noted. I explained the natural history of the disease process and Rx rationale.  I explained to the patient what I considered to be reasonable expectations  given their personal situation.  The final treatment plan was arrived at through a shared patient decision making process model.     I personally saw and evaluated the patient, and participated in the management and treatment plan.  Huel Cote, MD Attending Physician, Orthopedic Surgery  This document was dictated using Dragon voice recognition software. A reasonable attempt at proof reading has been made to minimize errors.

## 2023-04-10 ENCOUNTER — Telehealth: Payer: Self-pay | Admitting: Orthopaedic Surgery

## 2023-04-10 NOTE — Telephone Encounter (Signed)
Patient left voicemail message stating she received your call and has a couple of questions regarding surgery.  She is thinking August would be a good time for the procedure, but her mother wants to get an idea of the cost and what the insurance will pay.  Best call back number is patient's cell at [phone].   Please call patient before calling mother.

## 2023-04-16 NOTE — Telephone Encounter (Signed)
I spoke with the patient, and her mother in regards to surgery. Patient has been scheduled for surgery on 06/10/23 at [facility]. All questions were answered.

## 2023-05-02 ENCOUNTER — Other Ambulatory Visit: Payer: Self-pay | Admitting: Radiology

## 2023-05-02 DIAGNOSIS — L680 Hirsutism: Secondary | ICD-10-CM

## 2023-05-02 DIAGNOSIS — N926 Irregular menstruation, unspecified: Secondary | ICD-10-CM

## 2023-05-02 NOTE — Telephone Encounter (Signed)
Medication refill request: Tara Gibbs Last OV:  02-08-23 Next AEX: 05-10-23 Last MMG (if hormonal medication request): none Refill authorized: please approve if appropriate

## 2023-05-10 ENCOUNTER — Encounter: Payer: Self-pay | Admitting: Radiology

## 2023-05-10 ENCOUNTER — Ambulatory Visit (INDEPENDENT_AMBULATORY_CARE_PROVIDER_SITE_OTHER): Payer: Commercial Managed Care - PPO | Admitting: Radiology

## 2023-05-10 DIAGNOSIS — N926 Irregular menstruation, unspecified: Secondary | ICD-10-CM

## 2023-05-10 DIAGNOSIS — L680 Hirsutism: Secondary | ICD-10-CM | POA: Diagnosis not present

## 2023-05-10 MED ORDER — DROSPIRENONE-ETHINYL ESTRADIOL 3-0.02 MG PO TABS: 1.0000 | ORAL_TABLET | Freq: Every day | ORAL | 4 refills | Status: DC

## 2023-05-10 NOTE — Progress Notes (Signed)
   Tara Gibbs 02-Jun-2006 [id number]   History: 17 y.o. G0 here for follow up after starting OCPs. She reports she missed pills and had BTB last week. Started new pack of OCPs last Sunday after missing 2 nonconsecutive pills instead of doubling up.  Gynecologic History No LMP recorded. (Menstrual status: Irregular Periods). Contraception: OCP (estrogen/progesterone)  Obstetric History OB History  Gravida Para Term Preterm AB Living  0 0 0 0 0 0  SAB IAB Ectopic Multiple Live Births  0 0 0 0 0     The following portions of the patient's history were reviewed and updated as appropriate: allergies, current medications, past family history, past medical history, past social history, past surgical history, and problem list.  Review of Systems Pertinent items noted in HPI and remainder of comprehensive ROS otherwise negative.     Past medical history, past surgical history, family history and social history were all reviewed and documented in the EPIC chart.  ROS:  A ROS was performed and pertinent positives and negatives are included.  Exam:  Vitals:   05/10/23 0816  BP: 102/64  Weight: 205 lb (93 kg)   Body mass index is 35.19 kg/m.   Assessment/Plan: 1. Irregular menses - drospirenone-ethinyl estradiol (NIKKI) 3-0.02 MG tablet; Take 1 tablet by mouth daily.  Dispense: 84 tablet; Refill: 4  2. Hirsutism - drospirenone-ethinyl estradiol (NIKKI) 3-0.02 MG tablet; Take 1 tablet by mouth daily.  Dispense: 84 tablet; Refill: 4   Doing well, discussed the importance of taking on time. Declined any other method. F/u for AEX 19 mos. Arlie Solomons, WHNP-BC

## 2023-06-24 ENCOUNTER — Encounter (HOSPITAL_BASED_OUTPATIENT_CLINIC_OR_DEPARTMENT_OTHER): Payer: Commercial Managed Care - PPO | Admitting: Orthopaedic Surgery

## 2023-10-14 ENCOUNTER — Ambulatory Visit: Payer: Self-pay | Admitting: Pediatrics

## 2023-10-14 NOTE — Telephone Encounter (Signed)
Copied from CRM [id number]. Topic: Clinical - Red Word Triage >> Oct 14, 2023 12:27 PM Isabell A wrote: Red Word that prompted transfer to Nurse Triage: Chest pain due to cough  Chief Complaint: Dry cough Symptoms: Cough and chest discomfort related to cough Frequency: Over the weekend Pertinent Negatives: Patient denies fever Disposition: [] ED /[] Urgent Care (no appt availability in office) / [] Appointment(In office/virtual)/ []  [facility]/ [] Home Care/ [] Refused Recommended Disposition /[] [facility]/ []  Follow-up with PCP Additional Notes: Patient called in complaining of a dry hacking cough that is causing chest discomfort. Patient stated that yellowish phlegm is sometimes present. Patient is not established with a PCP and wished to be seen in the office for cough as well as establish PCP. Patient denies difficulty breathing, but states she feels winded and an increase in HR when moving around. Advised UC in order to see a provider today. Assisted patient with locating an UC that is nearby with no wait. Advised patient to go now, but she said she may not be able to go until tomorrow when she is not at work. Also assisted patient with making a new patient visit in order to establish a PCP. Scheduled for 1/24 at [facility] at [facility].   Reason for Disposition  Wheezing is present  Answer Assessment - Initial Assessment Questions 1. ONSET: "When did the cough begin?"      Over the weekend  2. SEVERITY: "How bad is the cough today?"      "Annoying"  3. SPUTUM: "Describe the color of your sputum" (none, dry cough; clear, white, yellow, green)     Yellowish phlegm present  4. HEMOPTYSIS: "Are you coughing up any blood?" If so ask: "How much?" (flecks, streaks, tablespoons, etc.)     Denies blood  5. DIFFICULTY BREATHING: "Are you having difficulty breathing?" If Yes, ask: "How bad is it?" (e.g., mild, moderate, severe)    - MILD: No SOB at rest, mild SOB with walking,  speaks normally in sentences, can lie down, no retractions, pulse < 100.    - MODERATE: SOB at rest, SOB with minimal exertion and prefers to sit, cannot lie down flat, speaks in phrases, mild retractions, audible wheezing, pulse 100-120.    - SEVERE: Very SOB at rest, speaks in single words, struggling to breathe, sitting hunched forward, retractions, pulse > 120      Moderate- "feels winded" when moving around and talking, slight wheezing present  6. FEVER: "Do you have a fever?" If Yes, ask: "What is your temperature, how was it measured, and when did it start?"     Denies- mom has been taking temperature  7. CARDIAC HISTORY: "Do you have any history of heart disease?" (e.g., heart attack, congestive heart failure)      Denies  8. LUNG HISTORY: "Do you have any history of lung disease?"  (e.g., pulmonary embolus, asthma, emphysema)     Denies  10. OTHER SYMPTOMS: "Do you have any other symptoms?" (e.g., runny nose, wheezing, chest pain)       Interferring with sleep, chest pain related to cough, slight sinus pressure, congestion in throat, increased HR with movement  Protocols used: Cough - Acute Non-Productive-A-AH

## 2023-10-15 ENCOUNTER — Other Ambulatory Visit (HOSPITAL_BASED_OUTPATIENT_CLINIC_OR_DEPARTMENT_OTHER): Payer: Self-pay

## 2023-10-15 ENCOUNTER — Ambulatory Visit: Payer: Commercial Managed Care - PPO

## 2023-10-15 ENCOUNTER — Ambulatory Visit
Admission: EM | Admit: 2023-10-15 | Discharge: 2023-10-15 | Disposition: A | Discharge status: Home / Self Care | Payer: Commercial Managed Care - PPO

## 2023-10-15 DIAGNOSIS — J209 Acute bronchitis, unspecified: Secondary | ICD-10-CM | POA: Diagnosis not present

## 2023-10-15 MED ORDER — PREDNISONE 20 MG PO TABS
40.0000 mg | ORAL_TABLET | Freq: Every day | ORAL | 0 refills | Status: AC
  Filled 2023-10-15: qty 10, 5d supply, fill #0

## 2023-10-15 MED ORDER — AZITHROMYCIN 250 MG PO TABS
250.0000 mg | ORAL_TABLET | Freq: Every day | ORAL | 0 refills | Status: DC
  Filled 2023-10-15: qty 6, 5d supply, fill #0

## 2023-10-15 NOTE — ED Triage Notes (Signed)
"  Started with Cough Saturday night now with seal sounding bark, chest pain/headache only with cough, I fell I have sob at times".

## 2023-10-15 NOTE — ED Provider Notes (Signed)
 [facility]    CSN: [id number] Arrival date & time: 10/15/23  1057      History   Chief Complaint Chief Complaint  Patient presents with   Cough    HPI Tara Gibbs is a 17 y.o. female.   Patient here today for evaluation of cough that started several months ago that has now turned into a barking cough.  She reports some headache at times with cough.  She does have shortness of breath at times as well.  She denies any fever.  The history is provided by the patient.  Cough Associated symptoms: no chills, no ear pain, no eye discharge, no fever, no shortness of breath, no sore throat and no wheezing     Past Medical History:  Diagnosis Date   Eczema     Patient Active Problem List   Diagnosis Date Noted   Arthralgia 05/10/2021   Irregular menses 06/12/2019   Chronic back pain 12/04/2016   Acne 07/11/2016   State of stress 02/29/2016   Chronic rhinitis 01/13/2016   Frequent headaches 01/13/2016   Right ankle pain 07/18/2015   Obesity, unspecified 06/07/2014   Eczema 06/07/2014    Past Surgical History:  Procedure Laterality Date   ADENOIDECTOMY     TONSILLECTOMY      OB History     Gravida  0   Para  0   Term  0   Preterm  0   AB  0   Living  0      SAB  0   IAB  0   Ectopic  0   Multiple  0   Live Births  0            Home Medications    Prior to Admission medications   Medication Sig Start Date End Date Taking? Authorizing Provider  azithromycin  (ZITHROMAX ) 250 MG tablet Take 2 tablets (500 mg total) by mouth on day 1, and then take 1 tablet (250 mg total) daily on days 2 through 5. 10/15/23  Yes Billy Asberry FALCON, PA-C  drospirenone -ethinyl estradiol  (NIKKI ) 3-0.02 MG tablet Take 1 tablet by mouth daily. 05/10/23  Yes Chrzanowski, Jami B, NP  predniSONE  (DELTASONE ) 20 MG tablet Take 2 tablets (40 mg total) by mouth daily with breakfast for 5 days. 10/15/23 10/20/23 Yes Billy Asberry FALCON, PA-C  UNABLE TO FIND Med  Name: Multi-Symptom cold and Flu.   Yes [provider]  FLUCELVAX 0.5 ML injection Inject 0.5 mLs into the muscle once. 06/25/23   [provider]    Family History Family History  Problem Relation Age of Onset   Hashimoto's thyroiditis Mother    Rheum arthritis Mother    Migraines Mother    Heart disease Mother    Polycystic ovary syndrome Mother    Eczema Father    Asthma Father     Social History Social History   Tobacco Use   Smoking status: Never    Passive exposure: Never   Smokeless tobacco: Never  Substance Use Topics   Alcohol use: No   Drug use: No     Allergies   Doxycycline  and Penicillins   Review of Systems Review of Systems  Constitutional:  Negative for chills and fever.  HENT:  Positive for congestion. Negative for ear pain and sore throat.   Eyes:  Negative for discharge and redness.  Respiratory:  Positive for cough. Negative for shortness of breath and wheezing.   Gastrointestinal:  Negative for abdominal  pain, diarrhea, nausea and vomiting.     Physical Exam Triage Vital Signs ED Triage Vitals  Encounter Vitals Group     BP 10/15/23 1106 110/68     Systolic BP Percentile --      Diastolic BP Percentile --      Pulse Rate 10/15/23 1106 86     Resp 10/15/23 1106 20     Temp 10/15/23 1106 98.7 F (37.1 C)     Temp Source 10/15/23 1106 Oral     SpO2 10/15/23 1106 98 %     Weight 10/15/23 1107 204 lb (92.5 kg)     Height 10/15/23 1107 5' 4 (1.626 m)     Head Circumference --      Peak Flow --      Pain Score 10/15/23 1106 0     Pain Loc --      Pain Education --      Exclude from Growth Chart --    No data found.  Updated Vital Signs BP 110/68 (BP Location: Right Arm)   Pulse 86   Temp 98.7 F (37.1 C) (Oral)   Resp 20   Ht 5' 4 (1.626 m)   Wt 204 lb (92.5 kg)   LMP 09/26/2023 (Exact Date)   SpO2 98%   BMI 35.02 kg/m   Visual Acuity Right Eye Distance:   Left Eye Distance:   Bilateral Distance:     Right Eye Near:   Left Eye Near:    Bilateral Near:     Physical Exam Vitals and nursing note reviewed.  Constitutional:      General: She is not in acute distress.    Appearance: Normal appearance. She is not ill-appearing.  HENT:     Head: Normocephalic and atraumatic.     Nose: Congestion present.     Mouth/Throat:     Mouth: Mucous membranes are moist.     Pharynx: No oropharyngeal exudate or posterior oropharyngeal erythema.  Eyes:     Conjunctiva/sclera: Conjunctivae normal.  Cardiovascular:     Rate and Rhythm: Normal rate and regular rhythm.     Heart sounds: Normal heart sounds. No murmur heard. Pulmonary:     Effort: Pulmonary effort is normal. No respiratory distress.     Breath sounds: Normal breath sounds. No wheezing, rhonchi or rales.     Comments: Barking cough observed Skin:    General: Skin is warm and dry.  Neurological:     Mental Status: She is alert.  Psychiatric:        Mood and Affect: Mood normal.        Thought Content: Thought content normal.      UC Treatments / Results  Labs (all labs ordered are listed, but only abnormal results are displayed) Labs Reviewed - No data to display  EKG   Radiology No results found.  Procedures Procedures (including critical care time)  Medications Ordered in UC Medications - No data to display  Initial Impression / Assessment and Plan / UC Course  I have reviewed the triage vital signs and the nursing notes.  Pertinent labs & imaging results that were available during my care of the patient were reviewed by me and considered in my medical decision making (see chart for details).    Suspect mild bronchitis. Will treat with steroid burst and zpak and advised follow up if no gradual improvement or with any further concerns.   Final Clinical Impressions(s) / UC Diagnoses   Final diagnoses:  Acute bronchitis, unspecified organism   Discharge Instructions   None    ED Prescriptions      Medication Sig Dispense Auth. Provider   predniSONE  (DELTASONE ) 20 MG tablet Take 2 tablets (40 mg total) by mouth daily with breakfast for 5 days. 10 tablet Billy Stabs F, PA-C   azithromycin  (ZITHROMAX ) 250 MG tablet Take 2 tablets (500 mg total) by mouth on day 1, and then take 1 tablet (250 mg total) daily on days 2 through 5. 6 tablet Billy Stabs FALCON, PA-C      PDMP not reviewed this encounter.   Billy Stabs FALCON, PA-C 10/15/23 [phone]

## 2023-11-06 NOTE — Progress Notes (Deleted)
New Patient Office Visit  Subjective    Patient ID: Tara Gibbs, female    DOB: 2006-08-02  Age: 18 y.o. MRN: [id number]  CC: No chief complaint on file.   HPI Tara Gibbs presents to establish care today. Sees OBGYN  Outpatient Encounter Medications as of 11/08/2023  Medication Sig   azithromycin (ZITHROMAX) 250 MG tablet Take 2 tablets (500 mg total) by mouth on day 1, and then take 1 tablet (250 mg total) daily on days 2 through 5.   drospirenone-ethinyl estradiol (NIKKI) 3-0.02 MG tablet Take 1 tablet by mouth daily.   FLUCELVAX 0.5 ML injection Inject 0.5 mLs into the muscle once.   UNABLE TO FIND Med Name: Multi-Symptom cold and Flu.   No facility-administered encounter medications on file as of 11/08/2023.    Past Medical History:  Diagnosis Date   Eczema     Past Surgical History:  Procedure Laterality Date   ADENOIDECTOMY     TONSILLECTOMY      Family History  Problem Relation Age of Onset   Hashimoto's thyroiditis Mother    Rheum arthritis Mother    Migraines Mother    Heart disease Mother    Polycystic ovary syndrome Mother    Eczema Father    Asthma Father     Social History   Socioeconomic History   Marital status: Single    Spouse name: Not on file   Number of children: Not on file   Years of education: Not on file   Highest education level: Not on file  Occupational History   Not on file  Tobacco Use   Smoking status: Never    Passive exposure: Never   Smokeless tobacco: Never  Vaping Use   Vaping status: Not on file  Substance and Sexual Activity   Alcohol use: No   Drug use: No   Sexual activity: Yes    Partners: Male    Birth control/protection: Condom    Comment: menarche 18yo, sexual debut 18yo  Other Topics Concern   Not on file  Social History Narrative   Not on file   Social Drivers of Health   Financial Resource Strain: Not on file  Food Insecurity: Not on file  Transportation Needs: Not on file  Physical  Activity: Not on file  Stress: Not on file  Social Connections: Not on file  Intimate Partner Violence: Not on file    ROS Per HPI      Objective    LMP 09/26/2023 (Exact Date)   Physical Exam Vitals and nursing note reviewed.  Constitutional:      Appearance: Normal appearance. She is normal weight.  HENT:     Head: Normocephalic and atraumatic.     Right Ear: Tympanic membrane and ear canal normal.     Left Ear: Tympanic membrane and ear canal normal.     Nose: Nose normal.  Eyes:     Extraocular Movements: Extraocular movements intact.     Pupils: Pupils are equal, round, and reactive to light.  Cardiovascular:     Rate and Rhythm: Normal rate and regular rhythm.     Heart sounds: Normal heart sounds.  Pulmonary:     Effort: Pulmonary effort is normal.     Breath sounds: Normal breath sounds.  Musculoskeletal:        General: Normal range of motion.     Cervical back: Normal range of motion.  Neurological:     General: No focal deficit present.  Mental Status: She is alert and oriented to person, place, and time.  Psychiatric:        Mood and Affect: Mood normal.        Thought Content: Thought content normal.         Assessment & Plan:   There are no diagnoses linked to this encounter.   No follow-ups on file.   Moshe Cipro, FNP

## 2023-11-08 ENCOUNTER — Ambulatory Visit: Payer: Commercial Managed Care - PPO | Admitting: Family Medicine

## 2023-11-11 ENCOUNTER — Ambulatory Visit: Payer: Self-pay | Admitting: General Practice

## 2023-11-11 NOTE — Telephone Encounter (Signed)
Chief Complaint: cough Symptoms: coughing fits Frequency: bronchitis diagnosed on 12/31, symptoms initially improved, cough still lingering Pertinent Negatives: Patient denies difficulty breathing while not coughing, wheezing, fever, N/V Disposition: [] ED /[] Urgent Care (no appt availability in office) / [x] Appointment(In office/virtual)/ []  [facility]/ [] Home Care/ [] Refused Recommended Disposition /[] [facility]/ []  Follow-up with PCP Additional Notes: Pt reports dry cough that results in coughing fits where she feels she has to gasp for air and dry heaves. Pt states she was dx with bronchitis at [facility] on 12/31 and prescribed prednisone and azithromycin. Pt states cough is still lingering. Pt reports difficulty breathing only while coughing and endorses chest pain with coughing that resolves after coughing fit is over. Pt denies fever, wheezing, N/V, bloody sputum, however pt endorses tasting blood with coughing. Per protocol, pt scheduled for tomorrow at 1315 at [facility]. Pt looking to get established with a PCP as well. Pt agreeable to the plan. Pt advised to call back for any worsening. Pt verbalized understanding.   Copied from CRM [id number]. Topic: Clinical - Red Word Triage >> Nov 11, 2023  2:34 PM Tara Gibbs wrote: Red Word that prompted transfer to Nurse Triage: Patient is still having shortness of breath. She stated that she has to grasp for air and it sucks the breath out of her while trying to cough. Patient did go to Urgent care and antibiotic helped but she still has the cough. Reason for Disposition  SEVERE coughing spells (e.g., whooping sound after coughing, vomiting after coughing)  Answer Assessment - Initial Assessment Questions 1. RESPIRATORY STATUS: "Describe your breathing?" (e.g., wheezing, shortness of breath, unable to speak, severe coughing)      Coughing fits that cause her to "gasp for air" and dry heave 2. ONSET: "When did this breathing problem  begin?"      Diagnosed with bronchitis on 12/31, prescribed prednisone and azithromycin. Started to feel better, cough died down, still have cough though. Problem now is cough takes breath away. 3. PATTERN "Does the difficult breathing come and go, or has it been constant since it started?"      Difficulty breathing during coughing fit, otherwise no difficulty other than being winded sometimes. 4. SEVERITY: "How bad is your breathing?" (e.g., mild, moderate, severe)    - MILD: No SOB at rest, mild SOB with walking, speaks normally in sentences, can lie down, no retractions, pulse < 100.    - MODERATE: SOB at rest, SOB with minimal exertion and prefers to sit, cannot lie down flat, speaks in phrases, mild retractions, audible wheezing, pulse 100-120.    - SEVERE: Very SOB at rest, speaks in single words, struggling to breathe, sitting hunched forward, retractions, pulse > 120      Mild, except for coughing fits. 5. RECURRENT SYMPTOM: "Have you had difficulty breathing before?" If Yes, ask: "When was the last time?" and "What happened that time?"      No 6. CARDIAC HISTORY: "Do you have any history of heart disease?" (e.g., heart attack, angina, bypass surgery, angioplasty)      No, but it runs in my family (both sides) 7. LUNG HISTORY: "Do you have any history of lung disease?"  (e.g., pulmonary embolus, asthma, emphysema)     No 8. CAUSE: "What do you think is causing the breathing problem?"      Bronchitis 9. OTHER SYMPTOMS: "Do you have any other symptoms? (e.g., dizziness, runny nose, cough, chest pain, fever)     Dry cough, chest  pain with coughing that lingers until "I get my breath back", sore throat in the AM that goes away but is there upon swallowing 10. O2 SATURATION MONITOR:  "Do you use an oxygen saturation monitor (pulse oximeter) at home?" If Yes, ask: "What is your reading (oxygen level) today?" "What is your usual oxygen saturation reading?" (e.g., 95%)       Does not check 11.  PREGNANCY: "Is there any chance you are pregnant?" "When was your last menstrual period?"       No 12. TRAVEL: "Have you traveled out of the country in the last month?" (e.g., travel history, exposures)       No  Answer Assessment - Initial Assessment Questions 1. ONSET: "When did the cough begin?"      12/31 2. SEVERITY: "How bad is the cough today?"      "Pretty consistent" 3. SPUTUM: "Describe the color of your sputum" (none, dry cough; clear, white, yellow, green)     None 4. HEMOPTYSIS: "Are you coughing up any blood?" If so ask: "How much?" (flecks, streaks, tablespoons, etc.)     "No, but I feel like I taste it" 5. DIFFICULTY BREATHING: "Are you having difficulty breathing?" If Yes, ask: "How bad is it?" (e.g., mild, moderate, severe)    - MILD: No SOB at rest, mild SOB with walking, speaks normally in sentences, can lie down, no retractions, pulse < 100.    - MODERATE: SOB at rest, SOB with minimal exertion and prefers to sit, cannot lie down flat, speaks in phrases, mild retractions, audible wheezing, pulse 100-120.    - SEVERE: Very SOB at rest, speaks in single words, struggling to breathe, sitting hunched forward, retractions, pulse > 120      Mild 6. FEVER: "Do you have a fever?" If Yes, ask: "What is your temperature, how was it measured, and when did it start?"     No 7. CARDIAC HISTORY: "Do you have any history of heart disease?" (e.g., heart attack, congestive heart failure)      No 8. LUNG HISTORY: "Do you have any history of lung disease?"  (e.g., pulmonary embolus, asthma, emphysema)     No 9. PE RISK FACTORS: "Do you have a history of blood clots?" (or: recent major surgery, recent prolonged travel, bedridden)     No 10. OTHER SYMPTOMS: "Do you have any other symptoms?" (e.g., runny nose, wheezing, chest pain)       CP with coughing  Protocols used: Breathing Difficulty-A-AH, Cough - Acute Productive-A-AH

## 2023-11-12 ENCOUNTER — Ambulatory Visit: Payer: Commercial Managed Care - PPO

## 2023-11-22 ENCOUNTER — Encounter: Payer: Self-pay | Admitting: Physician Assistant

## 2023-11-22 ENCOUNTER — Other Ambulatory Visit (HOSPITAL_BASED_OUTPATIENT_CLINIC_OR_DEPARTMENT_OTHER): Payer: Self-pay

## 2023-11-22 ENCOUNTER — Ambulatory Visit: Payer: Commercial Managed Care - PPO | Admitting: Physician Assistant

## 2023-11-22 ENCOUNTER — Ambulatory Visit (HOSPITAL_BASED_OUTPATIENT_CLINIC_OR_DEPARTMENT_OTHER)
Admission: RE | Admit: 2023-11-22 | Discharge: 2023-11-22 | Disposition: A | Discharge status: Home / Self Care | Payer: Commercial Managed Care - PPO | Source: Ambulatory Visit | Attending: Physician Assistant | Admitting: Physician Assistant

## 2023-11-22 VITALS — BP 112/76 | HR 91 | Temp 98.7°F | Ht 64.0 in | Wt 211.4 lb

## 2023-11-22 DIAGNOSIS — R052 Subacute cough: Secondary | ICD-10-CM | POA: Insufficient documentation

## 2023-11-22 DIAGNOSIS — R059 Cough, unspecified: Secondary | ICD-10-CM | POA: Diagnosis not present

## 2023-11-22 DIAGNOSIS — R0602 Shortness of breath: Secondary | ICD-10-CM | POA: Diagnosis not present

## 2023-11-22 DIAGNOSIS — J069 Acute upper respiratory infection, unspecified: Secondary | ICD-10-CM | POA: Diagnosis not present

## 2023-11-22 MED ORDER — ALBUTEROL SULFATE HFA 108 (90 BASE) MCG/ACT IN AERS
2.0000 | INHALATION_SPRAY | Freq: Four times a day (QID) | RESPIRATORY_TRACT | 0 refills | Status: DC | PRN
  Filled 2023-11-22: qty 6.7, 25d supply, fill #0

## 2023-11-22 MED ORDER — SPACER/AERO-HOLDING CHAMBERS DEVI
0 refills | Status: DC
  Filled 2023-11-22: qty 1, 1d supply, fill #0

## 2023-11-22 NOTE — Progress Notes (Signed)
 New patient visit   Patient: Tara Gibbs   DOB: May 25, 2006   18 y.o. Female  MRN: [id number] Visit Date: 11/22/2023  Today's healthcare provider: Manuelita Flatness, PA-C   C new patient establish care  Subjective    Tara Gibbs is a 18 y.o. female who presents today as a new patient to establish care.   Discussed the use of AI scribe software for clinical note transcription with the patient, who gave verbal consent to proceed.  History of Present Illness   The patient, with a history of bronchitis treated with prednisone  and azithromycin  on New Year's Eve, presents with persistent coughing fits that occur two to three times daily. These episodes are characterized by breathlessness, chest pain, and subsequent nausea. The coughing fits are random but can be triggered by laughter. The chest pain subsides a few minutes after the coughing fit ends. The patient also reports a recent onset of sore throat and nasal congestion, for the past few days, raising concerns of a new respiratory infection. She denies a history of asthma but mentions having been on breathing treatments as a child.       Past Medical History:  Diagnosis Date   Eczema    Past Surgical History:  Procedure Laterality Date   ADENOIDECTOMY     TONSILLECTOMY     Family Status  Relation Name Status   Mother  Alive   Father  Alive  No partnership data on file   Family History  Problem Relation Age of Onset   Hashimoto's thyroiditis Mother    Rheum arthritis Mother    Migraines Mother    Heart disease Mother    Polycystic ovary syndrome Mother    Eczema Father    Asthma Father    Social History   Socioeconomic History   Marital status: Single    Spouse name: Not on file   Number of children: Not on file   Years of education: Not on file   Highest education level: Not on file  Occupational History   Not on file  Tobacco Use   Smoking status: Never    Passive exposure: Never   Smokeless tobacco:  Never  Vaping Use   Vaping status: Not on file  Substance and Sexual Activity   Alcohol use: No   Drug use: No   Sexual activity: Yes    Partners: Male    Birth control/protection: Condom    Comment: menarche 18yo, sexual debut 18yo  Other Topics Concern   Not on file  Social History Narrative   Not on file   Social Drivers of Health   Financial Resource Strain: Not on file  Food Insecurity: Not on file  Transportation Needs: Not on file  Physical Activity: Not on file  Stress: Not on file  Social Connections: Not on file   Outpatient Medications Prior to Visit  Medication Sig   [DISCONTINUED] azithromycin  (ZITHROMAX ) 250 MG tablet Take 2 tablets (500 mg total) by mouth on day 1, and then take 1 tablet (250 mg total) daily on days 2 through 5.   [DISCONTINUED] drospirenone -ethinyl estradiol  (NIKKI ) 3-0.02 MG tablet Take 1 tablet by mouth daily.   [DISCONTINUED] FLUCELVAX 0.5 ML injection Inject 0.5 mLs into the muscle once.   [DISCONTINUED] UNABLE TO FIND Med Name: Multi-Symptom cold and Flu.   No facility-administered medications prior to visit.   Allergies  Allergen Reactions   Doxycycline  Other (See Comments)    Depression   Penicillins Swelling  Immunization History  Administered Date(s) Administered   DTaP 09/22/2004, 12/08/2004, 02/21/2005, 12/19/2005, 11/22/2008   HIB (PRP-OMP) 09/22/2004, 12/08/2004, 11/07/2005   HPV 9-valent 08/10/2016, 07/29/2017   Hepatitis A 12/19/2005, 12/13/2006   Hepatitis B 09/22/2004, 12/08/2004, 11/07/2005   IPV 09/22/2004, 12/08/2004, 12/19/2005, 11/22/2008   Influenza,inj,Quad PF,6+ Mos 10/14/2015, 07/11/2016, 07/29/2017, 08/28/2018, 06/19/2019   Influenza,inj,quad, With Preservative 07/15/2013, 10/05/2014   Influenza-Unspecified 11/07/2005, 12/12/2005, 09/15/2023   MMR 11/05/2005, 11/22/2008   MenQuadfi_Meningococcal Groups ACYW Conjugate 03/27/2021, 05/08/2022   Meningococcal Conjugate 08/10/2016   PFIZER(Purple  Top)SARS-COV-2 Vaccination 02/29/2020, 03/21/2020   PPD Test 02/12/2023   Pfizer Covid-19 Vaccine Bivalent Booster 2yrs & up 08/18/2021   Pfizer(Comirnaty)Fall Seasonal Vaccine 12 years and older 06/25/2023   Pneumococcal-Unspecified 09/22/2004, 12/08/2004, 02/21/2005, 11/07/2005   Polio, Unspecified 09/22/2004, 12/08/2004, 12/19/2005, 11/22/2008   Tdap 08/10/2016   Varicella 11/05/2005, 11/22/2008    Health Maintenance  Topic Date Due   Hepatitis C Screening  Never done   CHLAMYDIA SCREENING  01/31/2024   DTaP/Tdap/Td (7 - Td or Tdap) 08/10/2026   INFLUENZA VACCINE  Completed   HPV VACCINES  Completed   COVID-19 Vaccine  Completed   HIV Screening  Completed    Patient Care Team: Cyndi Shaver, PA-C as PCP - General (Physician Assistant) Gabriella Arthor GAILS, MD (Pediatrics)  Review of Systems  Constitutional:  Negative for fatigue and fever.  Respiratory:  Positive for cough. Negative for shortness of breath.   Cardiovascular:  Positive for chest pain. Negative for leg swelling.  Gastrointestinal:  Negative for abdominal pain.  Neurological:  Negative for dizziness and headaches.        Objective    BP 112/76   Pulse 91   Temp 98.7 F (37.1 C) (Oral)   Ht 5' 4 (1.626 m)   Wt 211 lb 6 oz (95.9 kg)   LMP 10/28/2023 (Approximate)   SpO2 98%   BMI 36.28 kg/m     Physical Exam Constitutional:      General: She is awake.     Appearance: She is well-developed.  HENT:     Head: Normocephalic.     Ears:     Comments: Bilateral mid ear effusion    Mouth/Throat:     Pharynx: Posterior oropharyngeal erythema present. No oropharyngeal exudate.  Eyes:     Conjunctiva/sclera: Conjunctivae normal.  Cardiovascular:     Rate and Rhythm: Normal rate and regular rhythm.     Heart sounds: Normal heart sounds.  Pulmonary:     Effort: Pulmonary effort is normal.     Breath sounds: Normal breath sounds. No wheezing, rhonchi or rales.  Skin:    General: Skin is warm.   Neurological:     Mental Status: She is alert and oriented to person, place, and time.  Psychiatric:        Attention and Perception: Attention normal.        Mood and Affect: Mood normal.        Speech: Speech normal.        Behavior: Behavior is cooperative.    Depression Screen    11/22/2023    2:28 PM 03/02/2020    1:46 PM 11/04/2019    2:10 PM 09/03/2019    2:58 PM  PHQ 2/9 Scores  PHQ - 2 Score 0 2 1 4   PHQ- 9 Score  3 4 14    No results found for any visits on 11/22/23.  Assessment & Plan     Subacute cough Differential includes post-viral cough, reactive airway  disease, or atypical pneumonia. - Order chest x-ray to rule out pneumonia  - Prescribe albuterol  inhaler with spacer, use every six hours as needed for coughing fits. - Monitor response to inhaler and report symptom changes.  -     DG Chest 2 View; Future -     Albuterol  Sulfate HFA; Inhale 2 puffs into the lungs every 6 (six) hours as needed for wheezing or shortness of breath.  Dispense: 6.7 g; Refill: 0  Upper respiratory tract infection, unspecified type - Suggest Claritin or Zyrtec  for congestion and tylenol/advil for throat pain. - Recommend Flonase  for nasal congestion.   Return if symptoms worsen or fail to improve.      Manuelita Flatness, PA-C  [facility] at [facility] [phone] (phone) [phone] (fax)  [facility]

## 2023-12-13 ENCOUNTER — Ambulatory Visit: Payer: Commercial Managed Care - PPO | Admitting: Family Medicine

## 2023-12-17 ENCOUNTER — Encounter (HOSPITAL_BASED_OUTPATIENT_CLINIC_OR_DEPARTMENT_OTHER): Payer: Self-pay

## 2023-12-17 ENCOUNTER — Other Ambulatory Visit: Payer: Self-pay

## 2023-12-17 DIAGNOSIS — R112 Nausea with vomiting, unspecified: Secondary | ICD-10-CM | POA: Diagnosis not present

## 2023-12-17 DIAGNOSIS — D72829 Elevated white blood cell count, unspecified: Secondary | ICD-10-CM | POA: Insufficient documentation

## 2023-12-17 DIAGNOSIS — R197 Diarrhea, unspecified: Secondary | ICD-10-CM | POA: Diagnosis not present

## 2023-12-17 LAB — CBC WITH DIFFERENTIAL/PLATELET
Abs Immature Granulocytes: 0.04 10*3/uL (ref 0.00–0.07)
Basophils Absolute: 0 10*3/uL (ref 0.0–0.1)
Basophils Relative: 0 %
Eosinophils Absolute: 0 10*3/uL (ref 0.0–0.5)
Eosinophils Relative: 0 %
HCT: 42.6 % (ref 36.0–46.0)
Hemoglobin: 13.7 g/dL (ref 12.0–15.0)
Immature Granulocytes: 0 %
Lymphocytes Relative: 7 %
Lymphs Abs: 1.1 10*3/uL (ref 0.7–4.0)
MCH: 28.2 pg (ref 26.0–34.0)
MCHC: 32.2 g/dL (ref 30.0–36.0)
MCV: 87.7 fL (ref 80.0–100.0)
Monocytes Absolute: 1.4 10*3/uL — ABNORMAL HIGH (ref 0.1–1.0)
Monocytes Relative: 8 %
Neutro Abs: 14.1 10*3/uL — ABNORMAL HIGH (ref 1.7–7.7)
Neutrophils Relative %: 85 %
Platelets: 393 10*3/uL (ref 150–400)
RBC: 4.86 MIL/uL (ref 3.87–5.11)
RDW: 13.3 % (ref 11.5–15.5)
WBC: 16.7 10*3/uL — ABNORMAL HIGH (ref 4.0–10.5)
nRBC: 0 % (ref 0.0–0.2)

## 2023-12-17 LAB — COMPREHENSIVE METABOLIC PANEL
ALT: 21 U/L (ref 0–44)
AST: 20 U/L (ref 15–41)
Albumin: 4.7 g/dL (ref 3.5–5.0)
Alkaline Phosphatase: 82 U/L (ref 38–126)
Anion gap: 9 (ref 5–15)
BUN: 13 mg/dL (ref 6–20)
CO2: 28 mmol/L (ref 22–32)
Calcium: 9.7 mg/dL (ref 8.9–10.3)
Chloride: 102 mmol/L (ref 98–111)
Creatinine, Ser: 0.8 mg/dL (ref 0.44–1.00)
GFR, Estimated: >60 mL/min (ref >60)
Glucose, Bld: 120 mg/dL — ABNORMAL HIGH (ref 70–99)
Potassium: 4.2 mmol/L (ref 3.5–5.1)
Sodium: 139 mmol/L (ref 135–145)
Total Bilirubin: 0.6 mg/dL (ref 0.0–1.2)
Total Protein: 7.9 g/dL (ref 6.5–8.1)

## 2023-12-17 LAB — LIPASE, BLOOD: Lipase: 10 U/L — ABNORMAL LOW (ref 11–51)

## 2023-12-17 MED ORDER — ONDANSETRON HCL 4 MG/2ML IJ SOLN
4.0000 mg | Freq: Once | INTRAMUSCULAR | Status: AC
  Administered 2023-12-17: 4 mg via INTRAVENOUS
  Filled 2023-12-17: qty 2

## 2023-12-17 MED ORDER — ONDANSETRON 4 MG PO TBDP: 4.0000 mg | ORAL_TABLET | Freq: Once | ORAL | Status: DC

## 2023-12-17 NOTE — ED Triage Notes (Signed)
 N/v and diarrhea x 2 days with right flank pain

## 2023-12-18 ENCOUNTER — Emergency Department (HOSPITAL_BASED_OUTPATIENT_CLINIC_OR_DEPARTMENT_OTHER)
Admission: EM | Admit: 2023-12-18 | Discharge: 2023-12-18 | Disposition: A | Discharge status: Home / Self Care | Attending: Emergency Medicine | Admitting: Emergency Medicine

## 2023-12-18 ENCOUNTER — Other Ambulatory Visit (HOSPITAL_BASED_OUTPATIENT_CLINIC_OR_DEPARTMENT_OTHER): Payer: Self-pay

## 2023-12-18 DIAGNOSIS — R112 Nausea with vomiting, unspecified: Secondary | ICD-10-CM

## 2023-12-18 LAB — URINALYSIS, ROUTINE W REFLEX MICROSCOPIC
Bilirubin Urine: NEGATIVE
Glucose, UA: NEGATIVE mg/dL
Hgb urine dipstick: NEGATIVE
Ketones, ur: 15 mg/dL — AB
Leukocytes,Ua: NEGATIVE
Nitrite: NEGATIVE
Specific Gravity, Urine: 1.025 (ref 1.005–1.030)
pH: 8 (ref 5.0–8.0)

## 2023-12-18 LAB — PREGNANCY, URINE: Preg Test, Ur: NEGATIVE

## 2023-12-18 MED ORDER — ONDANSETRON 4 MG PO TBDP
4.0000 mg | ORAL_TABLET | ORAL | 0 refills | Status: DC
  Filled 2023-12-18: qty 20, 4d supply, fill #0

## 2023-12-18 MED ORDER — MORPHINE SULFATE (PF) 4 MG/ML IV SOLN
4.0000 mg | Freq: Once | INTRAVENOUS | Status: AC
  Administered 2023-12-18: 4 mg via INTRAVENOUS
  Filled 2023-12-18: qty 1

## 2023-12-18 MED ORDER — SODIUM CHLORIDE 0.9 % IV BOLUS
1000.0000 mL | Freq: Once | INTRAVENOUS | Status: AC
  Administered 2023-12-18: 1000 mL via INTRAVENOUS

## 2023-12-18 MED ORDER — ONDANSETRON HCL 4 MG/2ML IJ SOLN
4.0000 mg | Freq: Once | INTRAMUSCULAR | Status: AC
  Administered 2023-12-18: 4 mg via INTRAVENOUS
  Filled 2023-12-18: qty 2

## 2023-12-18 NOTE — ED Provider Notes (Signed)
 [place] EMERGENCY DEPARTMENT AT [facility] Provider Note   CSN: [id number] Arrival date & time: 12/17/23  2227     History  Chief Complaint  Patient presents with   Nausea   Emesis    N/v and diarrhea x 2 days     Tara Gibbs is a 18 y.o. female.  18 yo F with a chief complaint of nausea vomiting and diarrhea.  This been going on for about 48 hours.  She had period of time where she felt a bit better and went to work and then end up coming home and had recurrent nausea and vomiting this evening.  Complaining also of right sided pain.   Emesis      Home Medications Prior to Admission medications   Medication Sig Start Date End Date Taking? Authorizing Provider  ondansetron (ZOFRAN-ODT) 4 MG disintegrating tablet 4mg  ODT q4 hours prn nausea/vomit 12/18/23  Yes Melene Plan, DO  albuterol (VENTOLIN HFA) 108 (90 Base) MCG/ACT inhaler Inhale 2 puffs into the lungs every 6 (six) hours as needed for wheezing or shortness of breath. 11/22/23   Alfredia Ferguson, PA-C  Spacer/Aero-Holding Chambers DEVI Use w/ albuterol inhaler every 6 hours as needed 11/22/23   Alfredia Ferguson, PA-C      Allergies    Doxycycline and Penicillins    Review of Systems   Review of Systems  Gastrointestinal:  Positive for vomiting.    Physical Exam Updated Vital Signs BP (!) 103/44   Pulse (!) 103   Temp 98.3 F (36.8 C) (Oral)   Resp 18   Ht 5\' 4"  (1.626 m)   Wt 93 kg   LMP 12/09/2023 (Approximate)   SpO2 100%   BMI 35.19 kg/m  Physical Exam Vitals and nursing note reviewed.  Constitutional:      General: She is not in acute distress.    Appearance: She is well-developed. She is not diaphoretic.  HENT:     Head: Normocephalic and atraumatic.  Eyes:     Pupils: Pupils are equal, round, and reactive to light.  Cardiovascular:     Rate and Rhythm: Normal rate and regular rhythm.     Heart sounds: No murmur heard.    No friction rub. No gallop.  Pulmonary:     Effort:  Pulmonary effort is normal.     Breath sounds: No wheezing or rales.  Abdominal:     General: There is no distension.     Palpations: Abdomen is soft.     Tenderness: There is no abdominal tenderness.     Comments: Benign abdominal exam  Musculoskeletal:        General: No tenderness.     Cervical back: Normal range of motion and neck supple.  Skin:    General: Skin is warm and dry.  Neurological:     Mental Status: She is alert and oriented to person, place, and time.  Psychiatric:        Behavior: Behavior normal.     ED Results / Procedures / Treatments   Labs (all labs ordered are listed, but only abnormal results are displayed) Labs Reviewed  CBC WITH DIFFERENTIAL/PLATELET - Abnormal; Notable for the following components:      Result Value   WBC 16.7 (*)    Neutro Abs 14.1 (*)    Monocytes Absolute 1.4 (*)    All other components within normal limits  COMPREHENSIVE METABOLIC PANEL - Abnormal; Notable for the following components:   Glucose, Bld 120 (*)  All other components within normal limits  URINALYSIS, ROUTINE W REFLEX MICROSCOPIC - Abnormal; Notable for the following components:   Ketones, ur 15 (*)    Protein, ur TRACE (*)    All other components within normal limits  LIPASE, BLOOD - Abnormal; Notable for the following components:   Lipase 10 (*)    All other components within normal limits  PREGNANCY, URINE    EKG None  Radiology No results found.  Procedures Procedures    Medications Ordered in ED Medications  ondansetron (ZOFRAN) injection 4 mg (4 mg Intravenous Given 12/17/23 2257)  morphine (PF) 4 MG/ML injection 4 mg (4 mg Intravenous Given 12/18/23 0218)  ondansetron (ZOFRAN) injection 4 mg (4 mg Intravenous Given 12/18/23 0215)  sodium chloride 0.9 % bolus 1,000 mL (0 mLs Intravenous Stopped 12/18/23 0340)  sodium chloride 0.9 % bolus 1,000 mL (0 mLs Intravenous Stopped 12/18/23 0446)    ED Course/ Medical Decision Making/ A&P                                  Medical Decision Making Amount and/or Complexity of Data Reviewed Labs: ordered.  Risk Prescription drug management.   18 yo F with a chief complaints of nausea vomiting and diarrhea.  Going on for about 48 hours.  Will give a bolus of IV fluids.  Pain medicine antiemetics reassess.  Leukocytosis, LFTs and lipase are unremarkable.  UA negative for infection.  Pregnancy test negative.  Patient is feeling bit better on repeat assessment.  Would like to go home.  Will discharge.  4:53 AM:  I have discussed the diagnosis/risks/treatment options with the patient.  Evaluation and diagnostic testing in the emergency department does not suggest an emergent condition requiring admission or immediate intervention beyond what has been performed at this time.  They will follow up with PCP. We also discussed returning to the ED immediately if new or worsening sx occur. We discussed the sx which are most concerning (e.g., sudden worsening pain, fever, inability to tolerate by mouth) that necessitate immediate return. Medications administered to the patient during their visit and any new prescriptions provided to the patient are listed below.  Medications given during this visit Medications  ondansetron (ZOFRAN) injection 4 mg (4 mg Intravenous Given 12/17/23 2257)  morphine (PF) 4 MG/ML injection 4 mg (4 mg Intravenous Given 12/18/23 0218)  ondansetron (ZOFRAN) injection 4 mg (4 mg Intravenous Given 12/18/23 0215)  sodium chloride 0.9 % bolus 1,000 mL (0 mLs Intravenous Stopped 12/18/23 0340)  sodium chloride 0.9 % bolus 1,000 mL (0 mLs Intravenous Stopped 12/18/23 0446)     The patient appears reasonably screen and/or stabilized for discharge and I doubt any other medical condition or other [facility] requiring further screening, evaluation, or treatment in the ED at this time prior to discharge.           Final Clinical Impression(s) / ED Diagnoses Final diagnoses:  Nausea vomiting and  diarrhea    Rx / DC Orders ED Discharge Orders          Ordered    ondansetron (ZOFRAN-ODT) 4 MG disintegrating tablet        12/18/23 0436              Melene Plan, DO 12/18/23 [phone]

## 2023-12-18 NOTE — Discharge Instructions (Signed)
 Take the nausea medicine for nausea.  You can take Imodium for diarrhea.  This is over-the-counter.  Please follow-up with your family doctor in the office.  Please return for sudden worsening abdominal discomfort or inability eat or drink.

## 2023-12-19 NOTE — Progress Notes (Signed)
      Established patient visit   Patient: Tara Gibbs   DOB: 07-12-2006   18 y.o. Female  MRN: [id number] Visit Date: 12/20/2023  Today's healthcare provider: Alfredia Ferguson, PA-C   Cc. ED f/u  Subjective     Pt was in the ED 12/18/22 for N/V, abdominal pain, diarrhea, syncope. She reports her symptoms were sudden and severe in onset. She was given IV fluids and antiemetics and improved.  Today, she feels back to her self, denies continued presyncopal symptoms, nausea, vomiting, abdominal pain. She is concerned as her glucose was 120 in the ED.   Medications: Outpatient Medications Prior to Visit  Medication Sig   [DISCONTINUED] albuterol (VENTOLIN HFA) 108 (90 Base) MCG/ACT inhaler Inhale 2 puffs into the lungs every 6 (six) hours as needed for wheezing or shortness of breath.   [DISCONTINUED] ondansetron (ZOFRAN-ODT) 4 MG disintegrating tablet Take 1 tablet (4 mg total) by mouth every 4 (four) hours as needed for nausea and vomiting.   [DISCONTINUED] Spacer/Aero-Holding Rudean Curt Use w/ albuterol inhaler every 6 hours as needed   No facility-administered medications prior to visit.    Review of Systems  Constitutional:  Negative for fatigue and fever.  Respiratory:  Negative for cough and shortness of breath.   Cardiovascular:  Negative for chest pain and leg swelling.  Gastrointestinal:  Negative for abdominal pain.  Neurological:  Negative for dizziness and headaches.       Objective    BP 108/76   Pulse 91   Ht 5\' 4"  (1.626 m)   Wt 209 lb 12.8 oz (95.2 kg)   LMP 12/09/2023 (Approximate)   BMI 36.01 kg/m    Physical Exam Constitutional:      General: She is awake.     Appearance: She is well-developed.  HENT:     Head: Normocephalic.  Eyes:     Conjunctiva/sclera: Conjunctivae normal.  Cardiovascular:     Rate and Rhythm: Normal rate and regular rhythm.     Heart sounds: Normal heart sounds.  Pulmonary:     Effort: Pulmonary effort is normal.      Breath sounds: Normal breath sounds.  Abdominal:     Palpations: Abdomen is soft.     Tenderness: There is no abdominal tenderness. There is no guarding.  Skin:    General: Skin is warm.  Neurological:     Mental Status: She is alert and oriented to person, place, and time.  Psychiatric:        Attention and Perception: Attention normal.        Mood and Affect: Mood normal.        Speech: Speech normal.        Behavior: Behavior is cooperative.      No results found for any visits on 12/20/23.  Assessment & Plan    Leukocytosis, unspecified type -     CBC with Differential/Platelet; Future  Hyperglycemia -     Comprehensive metabolic panel; Future -     Hemoglobin A1c; Future  Advised elevated white count and blood sugar likely 2/2 to GI illness. No residual symptoms today.  Advised waiting ~ 1 week, recommending fasting labs.  Return if symptoms worsen or fail to improve.      Alfredia Ferguson, PA-C  [facility] Primary Care at [facility] [phone] (phone) [phone] (fax)  [facility]

## 2023-12-20 ENCOUNTER — Encounter: Payer: Self-pay | Admitting: Physician Assistant

## 2023-12-20 ENCOUNTER — Ambulatory Visit: Admitting: Physician Assistant

## 2023-12-20 VITALS — BP 108/76 | HR 91 | Ht 64.0 in | Wt 209.8 lb

## 2023-12-20 DIAGNOSIS — D72829 Elevated white blood cell count, unspecified: Secondary | ICD-10-CM | POA: Diagnosis not present

## 2023-12-20 DIAGNOSIS — R739 Hyperglycemia, unspecified: Secondary | ICD-10-CM

## 2023-12-25 ENCOUNTER — Encounter: Payer: Self-pay | Admitting: Physician Assistant

## 2023-12-25 ENCOUNTER — Other Ambulatory Visit (INDEPENDENT_AMBULATORY_CARE_PROVIDER_SITE_OTHER)

## 2023-12-25 DIAGNOSIS — R739 Hyperglycemia, unspecified: Secondary | ICD-10-CM

## 2023-12-25 DIAGNOSIS — D72829 Elevated white blood cell count, unspecified: Secondary | ICD-10-CM | POA: Diagnosis not present

## 2023-12-25 LAB — CBC WITH DIFFERENTIAL/PLATELET
Basophils Absolute: 0 10*3/uL (ref 0.0–0.1)
Basophils Relative: 0.4 % (ref 0.0–3.0)
Eosinophils Absolute: 0 10*3/uL (ref 0.0–0.7)
Eosinophils Relative: 0.3 % (ref 0.0–5.0)
HCT: 42.7 % (ref 36.0–49.0)
Hemoglobin: 13.6 g/dL (ref 12.0–16.0)
Lymphocytes Relative: 26.2 % (ref 24.0–48.0)
Lymphs Abs: 1.9 10*3/uL (ref 0.7–4.0)
MCHC: 31.8 g/dL (ref 31.0–37.0)
MCV: 88.9 fl (ref 78.0–98.0)
Monocytes Absolute: 0.5 10*3/uL (ref 0.1–1.0)
Monocytes Relative: 7.3 % (ref 3.0–12.0)
Neutro Abs: 4.8 10*3/uL (ref 1.4–7.7)
Neutrophils Relative %: 65.8 % (ref 43.0–71.0)
Platelets: 402 10*3/uL (ref 150.0–575.0)
RBC: 4.81 Mil/uL (ref 3.80–5.70)
RDW: 14 % (ref 11.4–15.5)
WBC: 7.3 10*3/uL (ref 4.5–13.5)

## 2023-12-25 LAB — COMPREHENSIVE METABOLIC PANEL
ALT: 18 U/L (ref 0–35)
AST: 17 U/L (ref 0–37)
Albumin: 4.2 g/dL (ref 3.5–5.2)
Alkaline Phosphatase: 87 U/L (ref 47–119)
BUN: 10 mg/dL (ref 6–23)
CO2: 29 meq/L (ref 19–32)
Calcium: 9.6 mg/dL (ref 8.4–10.5)
Chloride: 104 meq/L (ref 96–112)
Creatinine, Ser: 0.74 mg/dL (ref 0.40–1.20)
GFR: 117.27 mL/min (ref >60.00)
Glucose, Bld: 92 mg/dL (ref 70–99)
Potassium: 4.6 meq/L (ref 3.5–5.1)
Sodium: 140 meq/L (ref 135–145)
Total Bilirubin: 0.4 mg/dL (ref 0.2–1.2)
Total Protein: 7 g/dL (ref 6.0–8.3)

## 2023-12-25 LAB — HEMOGLOBIN A1C: Hgb A1c MFr Bld: 5.6 % (ref 4.6–6.5)

## 2024-02-11 ENCOUNTER — Other Ambulatory Visit (HOSPITAL_BASED_OUTPATIENT_CLINIC_OR_DEPARTMENT_OTHER): Payer: Self-pay

## 2024-02-11 ENCOUNTER — Ambulatory Visit (INDEPENDENT_AMBULATORY_CARE_PROVIDER_SITE_OTHER): Payer: Commercial Managed Care - PPO | Admitting: Radiology

## 2024-02-11 ENCOUNTER — Encounter: Payer: Self-pay | Admitting: Radiology

## 2024-02-11 VITALS — BP 110/74 | Ht 65.0 in | Wt 216.4 lb

## 2024-02-11 DIAGNOSIS — N926 Irregular menstruation, unspecified: Secondary | ICD-10-CM | POA: Diagnosis not present

## 2024-02-11 DIAGNOSIS — Z1331 Encounter for screening for depression: Secondary | ICD-10-CM

## 2024-02-11 DIAGNOSIS — L700 Acne vulgaris: Secondary | ICD-10-CM | POA: Diagnosis not present

## 2024-02-11 DIAGNOSIS — L680 Hirsutism: Secondary | ICD-10-CM | POA: Diagnosis not present

## 2024-02-11 DIAGNOSIS — Z01419 Encounter for gynecological examination (general) (routine) without abnormal findings: Secondary | ICD-10-CM

## 2024-02-11 MED ORDER — DROSPIRENONE-ETHINYL ESTRADIOL 3-0.02 MG PO TABS
1.0000 | ORAL_TABLET | Freq: Every day | ORAL | 4 refills | Status: AC
  Filled 2024-02-11: qty 84, 84d supply, fill #0

## 2024-02-11 NOTE — Progress Notes (Signed)
   Tara Gibbs 11/13/2005 [id number]   History:  18 y.o. G0 presents for annual exam.Was started on Yaz last year for acne, irregular periods and hirsutism but stopped after 3 months because she was not consistent with taking. Would like to restart.  Gynecologic History Patient's last menstrual period was 01/14/2024 (exact date). Period Cycle (Days): 28 Period Duration (Days): 5 Period Pattern: Regular Menstrual Flow: Moderate Menstrual Control: Thin pad, Maxi pad Dysmenorrhea: (!) Severe Dysmenorrhea Symptoms: Cramping Contraception/Family planning: none Sexually active: yes   Obstetric History OB History  Gravida Para Term Preterm AB Living  0 0 0 0 0 0  SAB IAB Ectopic Multiple Live Births  0 0 0 0 0       02/11/2024    8:36 AM 11/22/2023    2:28 PM 03/02/2020    1:46 PM  Depression screen PHQ 2/9  Decreased Interest 0 0 0  Down, Depressed, Hopeless 0 0 2  PHQ - 2 Score 0 0 2  Altered sleeping   0  Tired, decreased energy   1  Change in appetite   0  Feeling bad or failure about yourself    0  Trouble concentrating   0  Moving slowly or fidgety/restless   0  PHQ-9 Score   3     The following portions of the patient's history were reviewed and updated as appropriate: allergies, current medications, past family history, past medical history, past social history, past surgical history, and problem list.  Review of Systems  All other systems reviewed and are negative.   Past medical history, past surgical history, family history and social history were all reviewed and documented in the EPIC chart.  Exam:  Vitals:   02/11/24 0835  BP: 110/74  Weight: 216 lb 6.4 oz (98.2 kg)  Height: 5\' 5"  (1.651 m)   Body mass index is 36.01 kg/m.  Physical Exam Constitutional:      Appearance: Normal appearance. She is obese.  Cardiovascular:     Rate and Rhythm: Normal rate and regular rhythm.  Pulmonary:     Effort: Pulmonary effort is normal.     Breath sounds:  Normal breath sounds.  Chest:  Breasts:    Right: Normal.     Left: Normal.  Abdominal:     General: Abdomen is flat. Bowel sounds are normal.     Palpations: Abdomen is soft.  Lymphadenopathy:     Upper Body:     Right upper body: No axillary adenopathy.     Left upper body: No axillary adenopathy.  Skin:    Findings: Acne present.  Neurological:     Mental Status: She is alert.  Psychiatric:        Mood and Affect: Mood normal.        Thought Content: Thought content normal.        Judgment: Judgment normal.    Ellis Guys, CMA present for exam  Assessment/Plan:   1. Well woman exam with routine gynecological exam (Primary) Pap at 19 Declines STI screen today, no new partners  2. Acne vulgaris  3. Hirsutism  4. Irregular menses - drospirenone -ethinyl estradiol  (YAZ) 3-0.02 MG tablet; Take 1 tablet by mouth daily.  Dispense: 84 tablet; Refill: 4    Return in about 1 year (around 02/10/2025) for Annual.  Laine Piggs WHNP-BC 8:53 AM 02/11/2024

## 2024-02-11 NOTE — Patient Instructions (Signed)

## 2024-02-18 ENCOUNTER — Encounter: Payer: Self-pay | Admitting: Radiology

## 2024-02-18 ENCOUNTER — Other Ambulatory Visit (HOSPITAL_COMMUNITY): Payer: Self-pay

## 2024-02-18 ENCOUNTER — Ambulatory Visit (INDEPENDENT_AMBULATORY_CARE_PROVIDER_SITE_OTHER): Admitting: Radiology

## 2024-02-18 VITALS — BP 102/76 | HR 99 | Temp 98.3°F | Wt 216.8 lb

## 2024-02-18 DIAGNOSIS — N926 Irregular menstruation, unspecified: Secondary | ICD-10-CM | POA: Diagnosis not present

## 2024-02-18 DIAGNOSIS — R140 Abdominal distension (gaseous): Secondary | ICD-10-CM

## 2024-02-18 DIAGNOSIS — N760 Acute vaginitis: Secondary | ICD-10-CM | POA: Diagnosis not present

## 2024-02-18 DIAGNOSIS — B9689 Other specified bacterial agents as the cause of diseases classified elsewhere: Secondary | ICD-10-CM | POA: Diagnosis not present

## 2024-02-18 LAB — WET PREP FOR TRICH, YEAST, CLUE

## 2024-02-18 MED ORDER — METRONIDAZOLE 500 MG PO TABS
500.0000 mg | ORAL_TABLET | Freq: Two times a day (BID) | ORAL | 0 refills | Status: DC
  Filled 2024-02-18: qty 14, 7d supply, fill #0

## 2024-02-18 NOTE — Progress Notes (Signed)
      Subjective: Tara Gibbs is a 18 y.o. female who complains of 1 week late for menses, bloating, nausea, increased acne, vaginal irritation. Symptoms began 4 days ago. Has not restarted her Yaz yet. Has had unprotected intercourse in there past 2 weeks. Negative UPT at home.      Review of Systems  All other systems reviewed and are negative.   Past Medical History:  Diagnosis Date   Eczema       Objective:  Today's Vitals   02/18/24 1204  BP: 102/76  Pulse: 99  Temp: 98.3 F (36.8 C)  TempSrc: Oral  SpO2: 97%  Weight: 216 lb 12.8 oz (98.3 kg)   Body mass index is 36.08 kg/m.   Physical Exam Vitals and nursing note reviewed. Exam conducted with a chaperone present.  Constitutional:      Appearance: Normal appearance. She is well-developed.  Pulmonary:     Effort: Pulmonary effort is normal.  Abdominal:     General: Abdomen is flat.     Palpations: Abdomen is soft.  Genitourinary:    General: Normal vulva.     Vagina: Vaginal discharge present. No erythema, bleeding or lesions.     Cervix: Normal. No discharge, friability, lesion or erythema.     Uterus: Normal.      Adnexa: Right adnexa normal and left adnexa normal.  Neurological:     Mental Status: She is alert.  Psychiatric:        Mood and Affect: Mood normal.        Thought Content: Thought content normal.        Judgment: Judgment normal.    Urine dipstick shows positive for leukocytes.  Micro exam: 6-10 WBC's per HPF and few+ bacteria.   Microscopic wet-mount exam shows clue cells.   Ellis Guys, CMA present for exam  Assessment:/Plan:   1. Missed period - Pregnancy, urine; neg  2. Abdominal bloating (Primary) - Urinalysis,Complete w/RFL Culture  3. BV (bacterial vaginosis) - metroNIDAZOLE  (FLAGYL ) 500 MG tablet; Take 1 tablet (500 mg total) by mouth 2 (two) times daily.  Dispense: 14 tablet; Refill: 0 - WET PREP FOR TRICH, YEAST, CLUE    Start OCPs with beginning of next period  and use a back up method x 2 weeks.  Avoid intercourse until symptoms are resolved. Safe sex encouraged. Avoid the use of soaps or perfumed products in the peri area. Avoid tub baths and sitting in sweaty or wet clothing for prolonged periods of time.     Abbas Beyene B, NP 12:27 PM

## 2024-02-19 LAB — URINALYSIS, COMPLETE W/RFL CULTURE
Bilirubin Urine: NEGATIVE
Glucose, UA: NEGATIVE
Hgb urine dipstick: NEGATIVE
Hyaline Cast: NONE SEEN /LPF
Ketones, ur: NEGATIVE
Nitrites, Initial: NEGATIVE
Protein, ur: NEGATIVE
RBC / HPF: NONE SEEN /HPF (ref 0–2)
Specific Gravity, Urine: 1.02 (ref 1.001–1.035)
pH: 7 (ref 5.0–8.0)

## 2024-02-19 LAB — CULTURE INDICATED

## 2024-02-19 LAB — URINE CULTURE
MICRO NUMBER:: 16419011
Result:: NO GROWTH
SPECIMEN QUALITY:: ADEQUATE

## 2024-02-19 LAB — PREGNANCY, URINE: Preg Test, Ur: NEGATIVE

## 2024-03-11 DIAGNOSIS — R519 Headache, unspecified: Secondary | ICD-10-CM | POA: Diagnosis not present

## 2024-03-11 DIAGNOSIS — E559 Vitamin D deficiency, unspecified: Secondary | ICD-10-CM | POA: Diagnosis not present

## 2024-03-11 DIAGNOSIS — M791 Myalgia, unspecified site: Secondary | ICD-10-CM | POA: Diagnosis not present

## 2024-03-11 DIAGNOSIS — E282 Polycystic ovarian syndrome: Secondary | ICD-10-CM | POA: Diagnosis not present

## 2024-03-11 DIAGNOSIS — L732 Hidradenitis suppurativa: Secondary | ICD-10-CM | POA: Diagnosis not present

## 2024-03-11 DIAGNOSIS — R5383 Other fatigue: Secondary | ICD-10-CM | POA: Diagnosis not present

## 2024-03-11 DIAGNOSIS — Z8639 Personal history of other endocrine, nutritional and metabolic disease: Secondary | ICD-10-CM | POA: Diagnosis not present

## 2024-03-11 LAB — TSH: TSH: 2.63 (ref 0.41–5.90)

## 2024-03-11 LAB — BASIC METABOLIC PANEL WITH GFR
BUN: 11 (ref 4–21)
Chloride: 102 (ref 99–108)
Creatinine: 0.8 (ref 0.5–1.1)
Glucose: 91
Potassium: 4.7 meq/L (ref 3.5–5.1)
Sodium: 137 (ref 137–147)

## 2024-03-11 LAB — LIPID PANEL
Cholesterol: 126 (ref 0–200)
HDL: 42 (ref 35–70)
LDL Cholesterol: 65
Triglycerides: 100 (ref 40–160)

## 2024-03-11 LAB — COMPREHENSIVE METABOLIC PANEL WITH GFR
Albumin: 4.1 (ref 3.5–5.0)
Calcium: 9.9 (ref 8.7–10.7)
Globulin: 3.1
eGFR: 104

## 2024-03-11 LAB — HEMOGLOBIN A1C: Hemoglobin A1C: 5.4

## 2024-03-11 LAB — HEPATIC FUNCTION PANEL
AST: 20 (ref 13–35)
Alkaline Phosphatase: 80 (ref 25–125)
Bilirubin, Total: 0.2

## 2024-03-11 LAB — CBC AND DIFFERENTIAL
HCT: 41 (ref 36–46)
Hemoglobin: 12.9 (ref 12.0–16.0)
Neutrophils Absolute: 5
Platelets: 389 10*3/uL (ref 150–400)
WBC: 8.5

## 2024-03-11 LAB — CBC: RBC: 4.53 (ref 3.87–5.11)

## 2024-03-11 LAB — VITAMIN B12: Vitamin B-12: 431

## 2024-03-11 LAB — VITAMIN D 25 HYDROXY (VIT D DEFICIENCY, FRACTURES): Vit D, 25-Hydroxy: 33.5

## 2024-03-24 ENCOUNTER — Ambulatory Visit (INDEPENDENT_AMBULATORY_CARE_PROVIDER_SITE_OTHER): Admitting: Physician Assistant

## 2024-03-24 ENCOUNTER — Other Ambulatory Visit (HOSPITAL_BASED_OUTPATIENT_CLINIC_OR_DEPARTMENT_OTHER): Payer: Self-pay

## 2024-03-24 ENCOUNTER — Encounter: Payer: Self-pay | Admitting: Physician Assistant

## 2024-03-24 VITALS — BP 114/79 | HR 93 | Ht 65.0 in | Wt 219.6 lb

## 2024-03-24 DIAGNOSIS — G43709 Chronic migraine without aura, not intractable, without status migrainosus: Secondary | ICD-10-CM

## 2024-03-24 DIAGNOSIS — E282 Polycystic ovarian syndrome: Secondary | ICD-10-CM | POA: Diagnosis not present

## 2024-03-24 DIAGNOSIS — R768 Other specified abnormal immunological findings in serum: Secondary | ICD-10-CM | POA: Diagnosis not present

## 2024-03-24 MED ORDER — SUMATRIPTAN SUCCINATE 50 MG PO TABS
50.0000 mg | ORAL_TABLET | Freq: Every day | ORAL | 1 refills | Status: AC | PRN
  Filled 2024-03-24: qty 9, 15d supply, fill #0

## 2024-03-24 MED ORDER — TOPIRAMATE 25 MG PO TABS
ORAL_TABLET | ORAL | 0 refills | Status: AC
  Filled 2024-03-24: qty 53, 30d supply, fill #0

## 2024-03-24 NOTE — Progress Notes (Signed)
 Established patient visit   Patient: Tara Gibbs   DOB: 2005-10-30   18 y.o. Female  MRN: [id number] Visit Date: 03/24/2024  Today's healthcare provider: Trenton Frock, PA-C   Cc. Thyroid  function tests, pcos questions, migraines.  Subjective     Discussed the use of AI scribe software for clinical note transcription with the patient, who gave verbal consent to proceed.  History of Present Illness   Tara Gibbs is a 18 year old female who presents for evaluation of thyroid  function and PCOS.  She is concerned about thyroid  function due to a family history of hypothyroidism. Previous blood work showed normal TSH levels but elevated TPO antibodies at 322. This bloodwork was completed in [place]  with an integrative medicine practitioner. She was recommended to start a thyroid  support complex vitamin.   For PCOS, she experiences menstrual irregularities unless managed with birth control, hirsutism requiring daily shaving, and acne on her chest. Her testosterone  levels were normal during the last blood work but she is on birth control. Her mother also has PCOS. She is curious about metformin use.  On labs her last A1c was 5.4%.  She experiences frequent migraines, nearly daily, managed with Tylenol and ibuprofen. No associated numbness, tingling, dizziness, or syncope, denies aura symptoms or acute visual abnormalities       Medications: Outpatient Medications Prior to Visit  Medication Sig   drospirenone -ethinyl estradiol  (YAZ) 3-0.02 MG tablet Take 1 tablet by mouth daily.   [DISCONTINUED] metroNIDAZOLE  (FLAGYL ) 500 MG tablet Take 1 tablet (500 mg total) by mouth 2 (two) times daily.   No facility-administered medications prior to visit.    Review of Systems  Constitutional:  Negative for fatigue and fever.  Respiratory:  Negative for cough and shortness of breath.   Cardiovascular:  Negative for chest pain and leg swelling.  Gastrointestinal:  Negative for  abdominal pain.  Neurological:  Positive for headaches. Negative for dizziness.       Objective    BP 114/79   Pulse 93   Ht 5\' 5"  (1.651 m)   Wt 219 lb 9.6 oz (99.6 kg)   BMI 36.54 kg/m    Physical Exam Constitutional:      General: She is awake.     Appearance: She is well-developed.  HENT:     Head: Normocephalic.  Eyes:     Extraocular Movements: Extraocular movements intact.     Conjunctiva/sclera: Conjunctivae normal.     Pupils: Pupils are equal, round, and reactive to light.  Cardiovascular:     Rate and Rhythm: Normal rate and regular rhythm.     Heart sounds: Normal heart sounds.  Pulmonary:     Effort: Pulmonary effort is normal.     Breath sounds: Normal breath sounds.  Skin:    General: Skin is warm.  Neurological:     General: No focal deficit present.     Mental Status: She is alert and oriented to person, place, and time. Mental status is at baseline.  Psychiatric:        Attention and Perception: Attention normal.        Mood and Affect: Mood normal.        Speech: Speech normal.        Behavior: Behavior is cooperative.     No results found for any visits on 03/24/24.  Assessment & Plan    Chronic migraine w/o aura, not intractable, w/o stat migr  - Prescribe Topamax 25 mg once  daily for one week, then increase to twice daily. - Prescribe sumatriptan for acute migraine attacks, cautioned frequent use - Advise against over-the-counter medications like ibuprofen and acetaminophen for migraines to avoid medication overuse headache. - Discussed potential benefits and side effects of Topamax, including weight loss, drowsiness, and cognitive effects, and propranolol as an alternative with fewer side effects. Emphasized early administration of triptans for optimal efficacy. - Advise to monitor for breakthrough bleeding while on birth control and report any changes -- as topamax can interfere w/ OCPS. Recommending backup contraceptive method.  - Schedule  a follow-up in one month to assess treatment effectiveness.  -     Topiramate; Take 1 tablet (25 mg total) by mouth at bedtime for 7 days, THEN 1 tablet (25 mg total) 2 (two) times daily for 23 days.  Dispense: 53 tablet; Refill: 0 -     SUMAtriptan Succinate; Take 1 tablet (50 mg total) by mouth daily as needed for migraine. May repeat in 2 hours if headache persists or recurs.  Dispense: 9 tablet; Refill: 1  Anti-TPO antibodies present On review of labs, TPO ab were elevated but TSH was normal . Advised we should monitor thyroid  labs q 4 mo.  Ok to start thyroid  support complex vitamin. -     TSH; Future -     T4, free; Future  Suspected PCOS Menstrual irregularities and hirsutism suggestive of PCOS. Current testosterone  levels are normal, possibly due to birth control use. On Yaz for menstrual regulation, which may also help with PCOS symptoms.  Discussed potential use of metformin for insulin resistance and weight management, but deferred starting it until migraine treatment is stabilized.       Return in about 4 weeks (around 04/21/2024) for migraines.       Trenton Frock, PA-C  [facility] at [facility] [phone] (phone) [phone] (fax)  [facility]

## 2024-03-26 NOTE — Telephone Encounter (Signed)
 Labs abstracted

## 2024-04-24 ENCOUNTER — Encounter: Payer: Self-pay | Admitting: Physician Assistant

## 2024-04-24 ENCOUNTER — Ambulatory Visit (INDEPENDENT_AMBULATORY_CARE_PROVIDER_SITE_OTHER): Admitting: Physician Assistant

## 2024-04-24 VITALS — BP 103/70 | HR 93 | Ht 65.0 in | Wt 217.4 lb

## 2024-04-24 DIAGNOSIS — G43709 Chronic migraine without aura, not intractable, without status migrainosus: Secondary | ICD-10-CM | POA: Insufficient documentation

## 2024-04-24 NOTE — Progress Notes (Signed)
 Established patient visit   Patient: Tara Gibbs   DOB: Mar 31, 2006   18 y.o. Female  MRN: [id number] Visit Date: 04/24/2024  Today's healthcare provider: Manuelita Flatness, PA-C   Cc. Migraine f/u  Subjective     Migraine follow up   Discussed the use of AI scribe software for clinical note transcription with the patient, who gave verbal consent to proceed.  History of Present Illness   The patient presents with stress-related headaches and medication side effects.  Headaches occur frequently and are associated with stress. Ibuprofen is used regularly for management, and sumatriptan  is effective for severe episodes. Topamax  was discontinued due to intolerable side effects, including tingling and mental cloudiness.  Significant life stressors include housing instability, financial stress, and the process of transferring to a new school. Previous living conditions were compromised by mold and bug issues.       Medications: Outpatient Medications Prior to Visit  Medication Sig   drospirenone -ethinyl estradiol  (YAZ) 3-0.02 MG tablet Take 1 tablet by mouth daily.   SUMAtriptan  (IMITREX ) 50 MG tablet Take 1 tablet (50 mg total) by mouth daily as needed for migraine. May repeat in 2 hours if headache persists or recurs.   topiramate  (TOPAMAX ) 25 MG tablet Take 1 tablet (25 mg total) by mouth at bedtime for 7 days, THEN 1 tablet (25 mg total) 2 (two) times daily for 23 days. (Patient not taking: No sig reported)   No facility-administered medications prior to visit.    Review of Systems  Constitutional:  Negative for fatigue and fever.  Respiratory:  Negative for cough and shortness of breath.   Cardiovascular:  Negative for chest pain and leg swelling.  Gastrointestinal:  Negative for abdominal pain.  Neurological:  Positive for headaches. Negative for dizziness.       Objective    BP 103/70   Pulse 93   Ht 5' 5 (1.651 m)   Wt 217 lb 6.4 oz (98.6 kg)   BMI 36.18  kg/m    Physical Exam Vitals reviewed.  Constitutional:      Appearance: She is not ill-appearing.  HENT:     Head: Normocephalic.  Eyes:     Conjunctiva/sclera: Conjunctivae normal.  Cardiovascular:     Rate and Rhythm: Normal rate.  Pulmonary:     Effort: Pulmonary effort is normal. No respiratory distress.  Neurological:     Mental Status: She is alert and oriented to person, place, and time.  Psychiatric:        Mood and Affect: Mood normal.        Behavior: Behavior normal.      No results found for any visits on 04/24/24.  Assessment & Plan    Chronic migraine w/o aura, not intractable, w/o stat migr Assessment & Plan: Chronic migraines, likely exacerbated by stress and lifestyle factors. Sumatriptan  is effective for acute relief. Topamax  was discontinued due to side effects including paresthesia.   Consideration of venlafaxine for migraine prevention and anxiety/depression management, but they are hesitant due to current life stressors --that once these resolve her symptoms might resolve as well.   - Maintain a headache diary to identify patterns and triggers. - Use sumatriptan  for severe migraines, up to 2-3 times per week. - Consider venlafaxine for migraine prevention if headaches persist and are debilitating. - Monitor ibuprofen use due to potential adverse effects on gastrointestinal and renal systems.      Return if symptoms worsen or fail to improve.  Manuelita Flatness, PA-C  [facility] at [facility] [phone] (phone) [phone] (fax)  [facility]

## 2024-04-24 NOTE — Assessment & Plan Note (Signed)
 Chronic migraines, likely exacerbated by stress and lifestyle factors. Sumatriptan  is effective for acute relief. Topamax  was discontinued due to side effects including paresthesia.   Consideration of venlafaxine for migraine prevention and anxiety/depression management, but they are hesitant due to current life stressors --that once these resolve her symptoms might resolve as well.   - Maintain a headache diary to identify patterns and triggers. - Use sumatriptan  for severe migraines, up to 2-3 times per week. - Consider venlafaxine for migraine prevention if headaches persist and are debilitating. - Monitor ibuprofen use due to potential adverse effects on gastrointestinal and renal systems.

## 2024-06-01 ENCOUNTER — Telehealth: Payer: Self-pay

## 2024-06-01 NOTE — Telephone Encounter (Signed)
 Patient called states that she has been having unprotected sex with her partner. She has been in consistant with taking her OCP. She has started bleeding. She is changing a pad ever 4-6 hours and having cramping. Patient is scheduled to see Jami on Wednesday. Advised patient to take an at home UPT and to use tylenol for cramping.

## 2024-06-03 ENCOUNTER — Encounter: Payer: Self-pay | Admitting: Radiology

## 2024-06-03 ENCOUNTER — Ambulatory Visit: Admitting: Radiology

## 2024-06-03 VITALS — BP 116/78 | Wt 223.0 lb

## 2024-06-03 DIAGNOSIS — N939 Abnormal uterine and vaginal bleeding, unspecified: Secondary | ICD-10-CM

## 2024-06-03 LAB — PREGNANCY, URINE: Preg Test, Ur: NEGATIVE

## 2024-06-03 NOTE — Progress Notes (Signed)
      Subjective: Tara Gibbs is a 18 y.o. female who complains of BTB and cramping with COC's (Symptoms began after having intercourse over the past weekend). Patient reports missing and taking COC's late first week of pack, doubled up the next day.   Review of Systems  All other systems reviewed and are negative.   Past Medical History:  Diagnosis Date   Eczema       Objective:  Today's Vitals   06/03/24 1050  BP: 116/78  Weight: 223 lb (101.2 kg)   Body mass index is 37.11 kg/m.   Physical Exam Vitals and nursing note reviewed. Exam conducted with a chaperone present.  Constitutional:      Appearance: Normal appearance. She is well-developed.  Pulmonary:     Effort: Pulmonary effort is normal.  Abdominal:     General: Abdomen is flat.     Palpations: Abdomen is soft.  Genitourinary:    General: Normal vulva.     Vagina: No vaginal discharge, erythema, bleeding or lesions.     Cervix: Normal. No discharge, friability, lesion or erythema.     Uterus: Normal.      Adnexa: Right adnexa normal and left adnexa normal.  Neurological:     Mental Status: She is alert.  Psychiatric:        Mood and Affect: Mood normal.        Thought Content: Thought content normal.        Judgment: Judgment normal.     Darice Hoit, CMA present for exam  Assessment:/Plan:   1. Abnormal uterine bleeding (AUB) (Primary) Discussed the importance of taking OCPs daily as directed. Offered to change to a different method, declines at this time.  Reassured normal exam, no bleeding or cramping today. - Pregnancy, urine; negative    Minette Manders B, NP 11:03 AM

## 2024-11-11 ENCOUNTER — Ambulatory Visit: Payer: Self-pay
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
## 2024-12-10 ENCOUNTER — Encounter: Admitting: Physician Assistant
